# Patient Record
Sex: Female | Born: 1950 | Race: White | Hispanic: No | Marital: Married | Smoking: Former smoker
Health system: Southern US, Community
[De-identification: ages and names within clinical notes are randomized; demographics above are authoritative.]

## PROBLEM LIST (undated history)

## (undated) DIAGNOSIS — E039 Hypothyroidism, unspecified: Secondary | ICD-10-CM

## (undated) DIAGNOSIS — E785 Hyperlipidemia, unspecified: Secondary | ICD-10-CM

## (undated) DIAGNOSIS — I2510 Atherosclerotic heart disease of native coronary artery without angina pectoris: Secondary | ICD-10-CM

## (undated) DIAGNOSIS — M545 Low back pain, unspecified: Secondary | ICD-10-CM

## (undated) DIAGNOSIS — K635 Polyp of colon: Secondary | ICD-10-CM

## (undated) HISTORY — DX: Low back pain, unspecified: M54.50

## (undated) HISTORY — PX: THYROIDECTOMY: SHX17

## (undated) HISTORY — PX: TOTAL HIP ARTHROPLASTY: SHX124

## (undated) HISTORY — DX: Hypothyroidism, unspecified: E03.9

## (undated) HISTORY — DX: Low back pain: M54.5

## (undated) HISTORY — DX: Polyp of colon: K63.5

## (undated) HISTORY — PX: BREAST LUMPECTOMY: SHX2

## (undated) HISTORY — DX: Hyperlipidemia, unspecified: E78.5

## (undated) HISTORY — PX: BREAST EXCISIONAL BIOPSY: SUR124

---

## 1998-01-08 ENCOUNTER — Encounter: Payer: Self-pay | Admitting: Family Medicine

## 1998-01-08 ENCOUNTER — Ambulatory Visit (HOSPITAL_COMMUNITY): Admission: RE | Admit: 1998-01-08 | Discharge: 1998-01-08 | Payer: Self-pay | Admitting: Family Medicine

## 1998-02-28 ENCOUNTER — Other Ambulatory Visit: Admission: RE | Admit: 1998-02-28 | Discharge: 1998-02-28 | Payer: Self-pay | Admitting: Family Medicine

## 1999-02-12 ENCOUNTER — Encounter: Payer: Self-pay | Admitting: Family Medicine

## 1999-02-12 ENCOUNTER — Ambulatory Visit (HOSPITAL_COMMUNITY): Admission: RE | Admit: 1999-02-12 | Discharge: 1999-02-12 | Payer: Self-pay | Admitting: Family Medicine

## 1999-04-08 ENCOUNTER — Other Ambulatory Visit: Admission: RE | Admit: 1999-04-08 | Discharge: 1999-04-08 | Payer: Self-pay | Admitting: Family Medicine

## 2000-03-17 ENCOUNTER — Ambulatory Visit (HOSPITAL_COMMUNITY): Admission: RE | Admit: 2000-03-17 | Discharge: 2000-03-17 | Payer: Self-pay | Admitting: Family Medicine

## 2000-03-17 ENCOUNTER — Encounter: Payer: Self-pay | Admitting: Family Medicine

## 2000-04-09 ENCOUNTER — Other Ambulatory Visit: Admission: RE | Admit: 2000-04-09 | Discharge: 2000-04-09 | Payer: Self-pay | Admitting: Family Medicine

## 2001-04-08 ENCOUNTER — Ambulatory Visit (HOSPITAL_COMMUNITY): Admission: RE | Admit: 2001-04-08 | Discharge: 2001-04-08 | Payer: Self-pay | Admitting: Family Medicine

## 2001-04-08 ENCOUNTER — Encounter: Payer: Self-pay | Admitting: Family Medicine

## 2001-04-29 ENCOUNTER — Other Ambulatory Visit: Admission: RE | Admit: 2001-04-29 | Discharge: 2001-04-29 | Payer: Self-pay | Admitting: Family Medicine

## 2002-05-17 ENCOUNTER — Encounter: Payer: Self-pay | Admitting: Family Medicine

## 2002-05-17 ENCOUNTER — Ambulatory Visit (HOSPITAL_COMMUNITY): Admission: RE | Admit: 2002-05-17 | Discharge: 2002-05-17 | Payer: Self-pay | Admitting: Family Medicine

## 2002-07-21 ENCOUNTER — Other Ambulatory Visit: Admission: RE | Admit: 2002-07-21 | Discharge: 2002-07-21 | Payer: Self-pay | Admitting: Family Medicine

## 2003-05-19 ENCOUNTER — Ambulatory Visit (HOSPITAL_COMMUNITY): Admission: RE | Admit: 2003-05-19 | Discharge: 2003-05-19 | Payer: Self-pay | Admitting: Family Medicine

## 2004-01-18 ENCOUNTER — Other Ambulatory Visit: Admission: RE | Admit: 2004-01-18 | Discharge: 2004-01-18 | Payer: Self-pay | Admitting: Family Medicine

## 2004-05-20 ENCOUNTER — Ambulatory Visit (HOSPITAL_COMMUNITY): Admission: RE | Admit: 2004-05-20 | Discharge: 2004-05-20 | Payer: Self-pay | Admitting: Family Medicine

## 2005-05-22 ENCOUNTER — Ambulatory Visit (HOSPITAL_COMMUNITY): Admission: RE | Admit: 2005-05-22 | Discharge: 2005-05-22 | Payer: Self-pay | Admitting: Family Medicine

## 2005-05-30 ENCOUNTER — Other Ambulatory Visit: Admission: RE | Admit: 2005-05-30 | Discharge: 2005-05-30 | Payer: Self-pay | Admitting: Family Medicine

## 2006-06-01 ENCOUNTER — Ambulatory Visit (HOSPITAL_COMMUNITY): Admission: RE | Admit: 2006-06-01 | Discharge: 2006-06-01 | Payer: Self-pay | Admitting: Family Medicine

## 2006-07-22 ENCOUNTER — Other Ambulatory Visit: Admission: RE | Admit: 2006-07-22 | Discharge: 2006-07-22 | Payer: Self-pay | Admitting: Family Medicine

## 2007-06-07 ENCOUNTER — Ambulatory Visit (HOSPITAL_COMMUNITY): Admission: RE | Admit: 2007-06-07 | Discharge: 2007-06-07 | Payer: Self-pay | Admitting: Family Medicine

## 2007-07-28 ENCOUNTER — Ambulatory Visit (HOSPITAL_COMMUNITY): Admission: RE | Admit: 2007-07-28 | Discharge: 2007-07-28 | Payer: Self-pay | Admitting: Family Medicine

## 2007-12-12 ENCOUNTER — Observation Stay (HOSPITAL_COMMUNITY): Admission: EM | Admit: 2007-12-12 | Discharge: 2007-12-13 | Payer: Self-pay | Admitting: Emergency Medicine

## 2008-06-09 ENCOUNTER — Ambulatory Visit (HOSPITAL_COMMUNITY): Admission: RE | Admit: 2008-06-09 | Discharge: 2008-06-09 | Payer: Self-pay | Admitting: Family Medicine

## 2009-03-27 ENCOUNTER — Encounter: Admission: RE | Admit: 2009-03-27 | Discharge: 2009-03-27 | Payer: Self-pay | Admitting: Orthopedic Surgery

## 2009-06-14 ENCOUNTER — Ambulatory Visit (HOSPITAL_COMMUNITY): Admission: RE | Admit: 2009-06-14 | Discharge: 2009-06-14 | Payer: Self-pay | Admitting: Family Medicine

## 2009-10-22 ENCOUNTER — Other Ambulatory Visit: Admission: RE | Admit: 2009-10-22 | Discharge: 2009-10-22 | Payer: Self-pay | Admitting: Family Medicine

## 2009-10-31 ENCOUNTER — Ambulatory Visit (HOSPITAL_COMMUNITY): Admission: RE | Admit: 2009-10-31 | Discharge: 2009-10-31 | Payer: Self-pay | Admitting: Family Medicine

## 2010-06-04 NOTE — H&P (Signed)
NAMEALFREDO, COLLYMORE                ACCOUNT NO.:  [id number]   MEDICAL RECORD NO.:  [id number]          PATIENT TYPE:  INP   LOCATION:  1827                         FACILITY:  [facility]   PHYSICIAN:  Kela Millin, M.D.DATE OF BIRTH:  [birth year]   DATE OF ADMISSION:  12/12/2007  DATE OF DISCHARGE:                              HISTORY & PHYSICAL   PRIMARY CARE PHYSICIAN:  Pam Drown, M.D.   CHIEF COMPLAINT:  Chest pain.   HISTORY OF PRESENT ILLNESS:  The patient is a 60 year old white female  former smoker with past medical history significant for hyperlipidemia  being managed by low cholesterol and low fat diet at this time,  hypothyroidism who presents with the above complaints.  She states that  she woke up at 6:00 this morning with chest pain.  Described as left  precordial in location and radiating to her upper arm and back, 5/10 in  intensity at its worst and a pressure, constant, for about 3 hours until  she went to the [facility] walk-in clinic.  At the walk-in clinic she was  given sublingual nitroglycerin with some relief.  She was asked to come  to the [facility] ER.  She states by the time she got to the [facility] ER the chest  pain had resolved but while here in the ER she walked to the bathroom  and in the process she developed some chest pain again, just not as  severe as before and it resolved spontaneously.  She denies associated  shortness of breath, nausea, vomiting and no diaphoresis.  She admits to  paresthesias in her left hand.  She admits to a cough and postnasal drip  and states that she had bronchitis recently and the cough is improving.  She denies fevers, dysuria, abdominal pain, diarrhea, melena and  hematochezia.   She was seen in the ER and a chest x-ray was done which showed  cardiomegaly but no acute infiltrates.  Her D-dimer was within normal  limits at 0.26.  Point of care markers negative times one and her EKG no  ischemic changes, normal sinus rhythm at  66.  She is admitted for  further evaluation and management.   PAST MEDICAL HISTORY:  As above.  Status post thyroidectomy for question  goiter.   MEDICATIONS:  1. Synthroid 200 mcg on Mondays, Wednesdays and Fridays and 175 mcg on      Tuesdays, Thursdays, Saturdays and Sundays.  2. Calcium.   ALLERGIES:  AMOXICILLIN.   SOCIAL HISTORY:  She states that she quit tobacco 5-6 years ago after  smoking two packs per day for 15 years.  Occasional glass of wine.   FAMILY HISTORY:  Her mother had lung cancer and her father had atrial  fibrillation and valvular heart disease - he died while in surgery for  his valves.   REVIEW OF SYSTEMS:  As per HPI, other review of systems negative.   PHYSICAL EXAM:  GENERAL:  In general, the patient is a pleasant middle-  aged white female.  She is alert and appropriate and in no apparent  distress.  VITAL SIGNS:  Temperature is 98.3 with a blood pressure of 130/74, pulse  66, respiratory rate of 18, O2 sat of 99%.  HEENT:  PERRL, EOMI, sclerae anicteric.  Moist mucous membranes and no  oral exudates.  NECK:  Supple, no adenopathy, no thyromegaly and no JVD.  LUNGS:  Clear to auscultation bilaterally.  No crackles or wheezes.  CARDIOVASCULAR:  Regular rate and rhythm.  Normal S1-S2.  No chest wall  tenderness.  No S3 appreciated.  ABDOMEN:  Soft, bowel sounds present, nontender, nondistended.  No  organomegaly and no masses palpable.  EXTREMITIES:  No cyanosis and no edema.   LABORATORY DATA:  As per HPI, also white cell count is 5.9, hemoglobin  13.5, hematocrit of 40, platelet count of 258.  Sodium is 140 with a  potassium of 4.5, chloride 105, CO2 of 29, glucose 103, BUN of 10,  creatinine 0.53, calcium 9.4, AST is 20, ALT is 19, albumin is 3.9.   ASSESSMENT AND PLAN:  1. Chest pain - as discussed above, will obtain serial cardiac enzymes      and recheck a fasting lipid profile.  Follow and consult cardiology      for possible stress  Cardiolite if enzymes negative.  Will place on      nitroglycerin and aspirin.  D-dimer negative as above.  2. Hypercholesterolemia - as above.  Will recheck, currently on low-      fat, low-cholesterol diet.  3. Hypothyroidism - continue on outpatient medications.      Kela Millin, M.D.  Electronically Signed     ACV/MEDQ  D:  12/12/2007  T:  12/12/2007  Job:  [id number]   cc:   Pam Drown, M.D.

## 2010-07-30 ENCOUNTER — Other Ambulatory Visit (HOSPITAL_COMMUNITY): Payer: Self-pay | Admitting: Family Medicine

## 2010-07-30 DIAGNOSIS — Z1231 Encounter for screening mammogram for malignant neoplasm of breast: Secondary | ICD-10-CM

## 2010-08-09 ENCOUNTER — Ambulatory Visit (HOSPITAL_COMMUNITY)
Admission: RE | Admit: 2010-08-09 | Discharge: 2010-08-09 | Disposition: A | Discharge status: Home / Self Care | Payer: BC Managed Care – PPO | Source: Ambulatory Visit | Attending: Family Medicine | Admitting: Family Medicine

## 2010-08-09 DIAGNOSIS — Z1231 Encounter for screening mammogram for malignant neoplasm of breast: Secondary | ICD-10-CM | POA: Insufficient documentation

## 2010-10-23 LAB — CBC
HCT: 40
Hemoglobin: 13.5
MCV: 95.4
Platelets: 258

## 2010-10-23 LAB — DIFFERENTIAL
Basophils Absolute: 0.1
Eosinophils Relative: 2
Monocytes Absolute: 0.5
Neutrophils Relative %: 51

## 2010-10-23 LAB — LIPID PANEL
LDL Cholesterol: 171 — ABNORMAL HIGH
Total CHOL/HDL Ratio: 6.3
Triglycerides: 125
VLDL: 25

## 2010-10-23 LAB — COMPREHENSIVE METABOLIC PANEL
ALT: 19
AST: 20
Alkaline Phosphatase: 55
BUN: 10
CO2: 29
Calcium: 9.4
GFR calc non Af Amer: >60
Sodium: 140

## 2010-10-23 LAB — TROPONIN I: Troponin I: <0.01

## 2010-10-23 LAB — D-DIMER, QUANTITATIVE: D-Dimer, Quant: 0.26

## 2010-10-23 LAB — CARDIAC PANEL(CRET KIN+CKTOT+MB+TROPI)
CK, MB: 1
Total CK: 47
Troponin I: <0.01

## 2010-10-23 LAB — POCT CARDIAC MARKERS

## 2010-10-23 LAB — PROTIME-INR: INR: 0.9

## 2011-02-14 ENCOUNTER — Other Ambulatory Visit: Payer: Self-pay | Admitting: Gastroenterology

## 2011-09-01 ENCOUNTER — Other Ambulatory Visit (HOSPITAL_COMMUNITY): Payer: Self-pay | Admitting: Family Medicine

## 2011-09-01 DIAGNOSIS — Z1231 Encounter for screening mammogram for malignant neoplasm of breast: Secondary | ICD-10-CM

## 2011-09-18 ENCOUNTER — Ambulatory Visit (HOSPITAL_COMMUNITY)
Admission: RE | Admit: 2011-09-18 | Discharge: 2011-09-18 | Disposition: A | Discharge status: Home / Self Care | Payer: BC Managed Care – PPO | Source: Ambulatory Visit | Attending: Family Medicine | Admitting: Family Medicine

## 2011-09-18 DIAGNOSIS — Z1231 Encounter for screening mammogram for malignant neoplasm of breast: Secondary | ICD-10-CM | POA: Insufficient documentation

## 2012-09-23 ENCOUNTER — Other Ambulatory Visit (HOSPITAL_COMMUNITY): Payer: Self-pay | Admitting: Family Medicine

## 2012-09-23 DIAGNOSIS — Z1231 Encounter for screening mammogram for malignant neoplasm of breast: Secondary | ICD-10-CM

## 2012-10-01 ENCOUNTER — Ambulatory Visit (HOSPITAL_COMMUNITY)
Admission: RE | Admit: 2012-10-01 | Discharge: 2012-10-01 | Disposition: A | Discharge status: Home / Self Care | Payer: BC Managed Care – PPO | Source: Ambulatory Visit | Attending: Family Medicine | Admitting: Family Medicine

## 2012-10-01 DIAGNOSIS — Z1231 Encounter for screening mammogram for malignant neoplasm of breast: Secondary | ICD-10-CM

## 2012-10-27 ENCOUNTER — Other Ambulatory Visit (HOSPITAL_COMMUNITY)
Admission: RE | Admit: 2012-10-27 | Discharge: 2012-10-27 | Disposition: A | Discharge status: Home / Self Care | Payer: BC Managed Care – PPO | Source: Ambulatory Visit | Attending: Family Medicine | Admitting: Family Medicine

## 2012-10-27 ENCOUNTER — Other Ambulatory Visit: Payer: Self-pay | Admitting: Family Medicine

## 2012-10-27 DIAGNOSIS — Z1151 Encounter for screening for human papillomavirus (HPV): Secondary | ICD-10-CM | POA: Insufficient documentation

## 2012-10-27 DIAGNOSIS — Z01419 Encounter for gynecological examination (general) (routine) without abnormal findings: Secondary | ICD-10-CM | POA: Insufficient documentation

## 2012-10-28 ENCOUNTER — Other Ambulatory Visit: Payer: Self-pay | Admitting: Family Medicine

## 2012-10-28 DIAGNOSIS — R519 Headache, unspecified: Secondary | ICD-10-CM

## 2012-11-01 ENCOUNTER — Encounter: Payer: Self-pay | Admitting: *Deleted

## 2012-11-01 ENCOUNTER — Encounter: Payer: Self-pay | Admitting: Cardiology

## 2012-11-01 DIAGNOSIS — K635 Polyp of colon: Secondary | ICD-10-CM | POA: Insufficient documentation

## 2012-11-01 DIAGNOSIS — E782 Mixed hyperlipidemia: Secondary | ICD-10-CM | POA: Insufficient documentation

## 2012-11-01 DIAGNOSIS — M545 Low back pain, unspecified: Secondary | ICD-10-CM | POA: Insufficient documentation

## 2012-11-01 DIAGNOSIS — E785 Hyperlipidemia, unspecified: Secondary | ICD-10-CM | POA: Insufficient documentation

## 2012-11-01 DIAGNOSIS — E039 Hypothyroidism, unspecified: Secondary | ICD-10-CM | POA: Insufficient documentation

## 2012-11-04 ENCOUNTER — Ambulatory Visit (INDEPENDENT_AMBULATORY_CARE_PROVIDER_SITE_OTHER): Payer: BC Managed Care – PPO | Admitting: Cardiology

## 2012-11-04 ENCOUNTER — Encounter: Payer: Self-pay | Admitting: Cardiology

## 2012-11-04 VITALS — BP 141/65 | HR 87 | Ht 67.0 in | Wt 194.0 lb

## 2012-11-04 DIAGNOSIS — Z79899 Other long term (current) drug therapy: Secondary | ICD-10-CM

## 2012-11-04 DIAGNOSIS — E7800 Pure hypercholesterolemia, unspecified: Secondary | ICD-10-CM

## 2012-11-04 MED ORDER — EZETIMIBE 10 MG PO TABS: 10.0000 mg | ORAL_TABLET | Freq: Every day | ORAL | Status: DC

## 2012-11-04 NOTE — Progress Notes (Signed)
  [address] [place], [place]  [address] Phone: [phone] Fax:  [phone]  Date:  11/04/2012   ID:  Rebekah Curry, DOB 1950/04/13, MRN [id number]  PCP:  Selena Batten, MD  Cardiologist:  None    History of Present Illness: Rebekah Curry is a 62 y.o. female  With a history of dyslipidemia, hypothyroidism and chronic back pain who presents for evaluation of dyslipidemia.  She has tried Crestor, zocor and liptor and had severe muscle aches which exacerbated her underlying back problems.  She is currently on no lipid lowering drugs.  She denies any chest pain, SOB, DOE, palpitations, LE edema or dizziness.   Wt Readings from Last 3 Encounters:  11/04/12 194 lb (87.998 kg)     Past Medical History  Diagnosis Date  . Hyperlipidemia   . Hypothyroidism   . Low back pain   . Colon polyp     Current Outpatient Prescriptions  Medication Sig Dispense Refill  . Cholecalciferol (VITAMIN D) 2000 UNITS CAPS Take 1 capsule by mouth daily.      Marland Kitchen levothyroxine (SYNTHROID, LEVOTHROID) 137 MCG tablet TAKE 2 TABLETS DAILY      . Multiple Vitamin (MULTIVITAMIN) capsule Take 1 capsule by mouth daily.       No current facility-administered medications for this visit.    Allergies:    Allergies  Allergen Reactions  . Amoxil [Amoxicillin]     RASH    Social History:  The patient  reports that she has quit smoking. She does not have any smokeless tobacco history on file. She reports that she does not drink alcohol or use illicit drugs.   Family History:  The patient's family history is not on file.   ROS:  Please see the history of present illness.      All other systems reviewed and negative.   PHYSICAL EXAM: VS:  BP 141/65  Pulse 87  Ht 5\' 7"  (1.702 m)  Wt 194 lb (87.998 kg)  BMI 30.38 kg/m2 Well nourished, well developed, in no acute distress HEENT: normal Neck: no JVD Cardiac:  normal S1, S2; RRR; no murmur Lungs:  clear to auscultation bilaterally, no  wheezing, rhonchi or rales Abd: soft, nontender, no hepatomegaly Ext: no edema Skin: warm and dry Neuro:  CNs 2-12 intact, no focal abnormalities noted   EKG:  NSR  ASSESSMENT AND PLAN:  1. Dyslipidemia - statin intolerant.  Her LDL is 199, HDL 50  - I have recommended that we start her on Zetia 10mg  daily  - recheck Fasting lipid panel/ALT in 8 weeks  - I have asked her to call if she has any symptoms on the zetia  Followup with me in 6 months   Signed, Armanda Magic, MD 11/04/2012 5:30 PM

## 2012-11-04 NOTE — Patient Instructions (Signed)
Your physician recommends that you return for lab work in: 8 weeks for you Lipid and hepatic panel (12/30/12)  Your physician recommends that you continue on your current medications as directed. Please refer to the Current Medication list given to you today.  I called in a rx for Zetia to your Pharmacy  Your physician recommends that you schedule a follow-up appointment in: 6 months with Dr. Mayford Knife.

## 2012-11-06 ENCOUNTER — Ambulatory Visit
Admission: RE | Admit: 2012-11-06 | Discharge: 2012-11-06 | Disposition: A | Discharge status: Home / Self Care | Payer: BC Managed Care – PPO | Source: Ambulatory Visit | Attending: Family Medicine | Admitting: Family Medicine

## 2012-11-06 DIAGNOSIS — R519 Headache, unspecified: Secondary | ICD-10-CM

## 2012-11-06 MED ORDER — GADOBENATE DIMEGLUMINE 529 MG/ML IV SOLN
19.0000 mL | Freq: Once | INTRAVENOUS | Status: AC | PRN
  Administered 2012-11-06: 19 mL via INTRAVENOUS

## 2012-12-30 ENCOUNTER — Other Ambulatory Visit (INDEPENDENT_AMBULATORY_CARE_PROVIDER_SITE_OTHER): Payer: BC Managed Care – PPO

## 2012-12-30 DIAGNOSIS — E7800 Pure hypercholesterolemia, unspecified: Secondary | ICD-10-CM

## 2012-12-30 DIAGNOSIS — Z79899 Other long term (current) drug therapy: Secondary | ICD-10-CM

## 2012-12-30 LAB — HEPATIC FUNCTION PANEL
ALT: 24 U/L (ref 0–35)
Alkaline Phosphatase: 62 U/L (ref 39–117)
Bilirubin, Direct: 0 mg/dL (ref 0.0–0.3)
Total Bilirubin: 0.6 mg/dL (ref 0.3–1.2)
Total Protein: 7.7 g/dL (ref 6.0–8.3)

## 2013-01-19 ENCOUNTER — Telehealth: Payer: Self-pay | Admitting: General Surgery

## 2013-01-19 ENCOUNTER — Encounter: Payer: Self-pay | Admitting: General Surgery

## 2013-01-19 DIAGNOSIS — E785 Hyperlipidemia, unspecified: Secondary | ICD-10-CM

## 2013-01-19 MED ORDER — COLESEVELAM HCL 3.75 G PO PACK: 1.0000 | PACK | Freq: Every day | ORAL | Status: DC

## 2013-01-19 NOTE — Telephone Encounter (Signed)
Message copied by Nita Sells on Wed Jan 19, 2013  9:12 AM ------      Message from: SMART, Gaspar Skeeters      Created: Thu Jan 06, 2013  3:46 PM       Only other option other than a statin would be to add Welchol to her Zetia.  Welchol can drop LDL another 20% reduction, and the combination of Zetia + Welchol can give a similar LDL reduction to a statin, but without the increase risk of muscle aches.  Welchol is very well tolerated.      Plan:      1.  Continue Zetia 10 mg qd.      2.  Add Welchol 3.75 g powder for suspension.  Mix 1 packet with 4-8 ounces of water or juice, and drink this once daily.  This should be taken at a different time of day as her thyroid medication.      3.  Recheck lipid panel and hepatic panel in 3 months, and we can call her with results.        Please notify patient, and set up labs. Thanks. ------

## 2013-04-20 ENCOUNTER — Other Ambulatory Visit: Payer: BC Managed Care – PPO

## 2013-10-06 ENCOUNTER — Other Ambulatory Visit (HOSPITAL_COMMUNITY): Payer: Self-pay | Admitting: Family Medicine

## 2013-10-06 DIAGNOSIS — Z1231 Encounter for screening mammogram for malignant neoplasm of breast: Secondary | ICD-10-CM

## 2013-10-13 ENCOUNTER — Ambulatory Visit (HOSPITAL_COMMUNITY)
Admission: RE | Admit: 2013-10-13 | Discharge: 2013-10-13 | Disposition: A | Discharge status: Home / Self Care | Payer: BC Managed Care – PPO | Source: Ambulatory Visit | Attending: Family Medicine | Admitting: Family Medicine

## 2013-10-13 DIAGNOSIS — Z1231 Encounter for screening mammogram for malignant neoplasm of breast: Secondary | ICD-10-CM | POA: Diagnosis present

## 2014-08-16 ENCOUNTER — Other Ambulatory Visit: Payer: Self-pay | Admitting: Family Medicine

## 2014-08-16 ENCOUNTER — Ambulatory Visit
Admission: RE | Admit: 2014-08-16 | Discharge: 2014-08-16 | Disposition: A | Discharge status: Home / Self Care | Payer: BLUE CROSS/BLUE SHIELD | Source: Ambulatory Visit | Attending: Family Medicine | Admitting: Family Medicine

## 2014-08-16 DIAGNOSIS — J189 Pneumonia, unspecified organism: Secondary | ICD-10-CM

## 2014-11-14 ENCOUNTER — Other Ambulatory Visit: Payer: Self-pay

## 2014-11-14 DIAGNOSIS — Z1231 Encounter for screening mammogram for malignant neoplasm of breast: Secondary | ICD-10-CM

## 2014-11-28 ENCOUNTER — Ambulatory Visit
Admission: RE | Admit: 2014-11-28 | Discharge: 2014-11-28 | Disposition: A | Discharge status: Home / Self Care | Payer: BLUE CROSS/BLUE SHIELD | Source: Ambulatory Visit

## 2014-11-28 DIAGNOSIS — Z1231 Encounter for screening mammogram for malignant neoplasm of breast: Secondary | ICD-10-CM

## 2015-01-21 HISTORY — PX: BREAST BIOPSY: SHX20

## 2015-06-06 DIAGNOSIS — E039 Hypothyroidism, unspecified: Secondary | ICD-10-CM | POA: Diagnosis not present

## 2015-06-06 DIAGNOSIS — E782 Mixed hyperlipidemia: Secondary | ICD-10-CM | POA: Diagnosis not present

## 2015-06-06 DIAGNOSIS — M8580 Other specified disorders of bone density and structure, unspecified site: Secondary | ICD-10-CM | POA: Diagnosis not present

## 2015-07-13 DIAGNOSIS — R319 Hematuria, unspecified: Secondary | ICD-10-CM | POA: Diagnosis not present

## 2015-07-13 DIAGNOSIS — R300 Dysuria: Secondary | ICD-10-CM | POA: Diagnosis not present

## 2015-07-16 DIAGNOSIS — E039 Hypothyroidism, unspecified: Secondary | ICD-10-CM | POA: Diagnosis not present

## 2015-07-16 DIAGNOSIS — J45909 Unspecified asthma, uncomplicated: Secondary | ICD-10-CM | POA: Diagnosis not present

## 2015-07-16 DIAGNOSIS — R319 Hematuria, unspecified: Secondary | ICD-10-CM | POA: Diagnosis not present

## 2015-07-18 DIAGNOSIS — R310 Gross hematuria: Secondary | ICD-10-CM | POA: Diagnosis not present

## 2015-07-20 DIAGNOSIS — R319 Hematuria, unspecified: Secondary | ICD-10-CM | POA: Diagnosis not present

## 2015-07-20 DIAGNOSIS — E039 Hypothyroidism, unspecified: Secondary | ICD-10-CM | POA: Diagnosis not present

## 2015-07-20 DIAGNOSIS — R300 Dysuria: Secondary | ICD-10-CM | POA: Diagnosis not present

## 2015-07-26 DIAGNOSIS — R310 Gross hematuria: Secondary | ICD-10-CM | POA: Diagnosis not present

## 2015-08-01 DIAGNOSIS — N8111 Cystocele, midline: Secondary | ICD-10-CM | POA: Diagnosis not present

## 2015-08-01 DIAGNOSIS — R310 Gross hematuria: Secondary | ICD-10-CM | POA: Diagnosis not present

## 2015-08-01 DIAGNOSIS — B373 Candidiasis of vulva and vagina: Secondary | ICD-10-CM | POA: Diagnosis not present

## 2015-08-01 DIAGNOSIS — N393 Stress incontinence (female) (male): Secondary | ICD-10-CM | POA: Diagnosis not present

## 2015-08-07 DIAGNOSIS — M4726 Other spondylosis with radiculopathy, lumbar region: Secondary | ICD-10-CM | POA: Diagnosis not present

## 2015-08-07 DIAGNOSIS — M47816 Spondylosis without myelopathy or radiculopathy, lumbar region: Secondary | ICD-10-CM | POA: Diagnosis not present

## 2015-08-09 DIAGNOSIS — M47816 Spondylosis without myelopathy or radiculopathy, lumbar region: Secondary | ICD-10-CM | POA: Diagnosis not present

## 2015-08-09 DIAGNOSIS — M4726 Other spondylosis with radiculopathy, lumbar region: Secondary | ICD-10-CM | POA: Diagnosis not present

## 2015-08-10 DIAGNOSIS — M4726 Other spondylosis with radiculopathy, lumbar region: Secondary | ICD-10-CM | POA: Diagnosis not present

## 2015-08-10 DIAGNOSIS — M47816 Spondylosis without myelopathy or radiculopathy, lumbar region: Secondary | ICD-10-CM | POA: Diagnosis not present

## 2015-08-13 DIAGNOSIS — M47816 Spondylosis without myelopathy or radiculopathy, lumbar region: Secondary | ICD-10-CM | POA: Diagnosis not present

## 2015-08-13 DIAGNOSIS — M4726 Other spondylosis with radiculopathy, lumbar region: Secondary | ICD-10-CM | POA: Diagnosis not present

## 2015-08-15 DIAGNOSIS — M47816 Spondylosis without myelopathy or radiculopathy, lumbar region: Secondary | ICD-10-CM | POA: Diagnosis not present

## 2015-08-15 DIAGNOSIS — M4726 Other spondylosis with radiculopathy, lumbar region: Secondary | ICD-10-CM | POA: Diagnosis not present

## 2015-08-16 DIAGNOSIS — M4726 Other spondylosis with radiculopathy, lumbar region: Secondary | ICD-10-CM | POA: Diagnosis not present

## 2015-08-16 DIAGNOSIS — M47816 Spondylosis without myelopathy or radiculopathy, lumbar region: Secondary | ICD-10-CM | POA: Diagnosis not present

## 2015-08-20 DIAGNOSIS — M47816 Spondylosis without myelopathy or radiculopathy, lumbar region: Secondary | ICD-10-CM | POA: Diagnosis not present

## 2015-08-20 DIAGNOSIS — M4726 Other spondylosis with radiculopathy, lumbar region: Secondary | ICD-10-CM | POA: Diagnosis not present

## 2015-08-23 DIAGNOSIS — M47816 Spondylosis without myelopathy or radiculopathy, lumbar region: Secondary | ICD-10-CM | POA: Diagnosis not present

## 2015-08-23 DIAGNOSIS — M4726 Other spondylosis with radiculopathy, lumbar region: Secondary | ICD-10-CM | POA: Diagnosis not present

## 2015-08-28 DIAGNOSIS — M4726 Other spondylosis with radiculopathy, lumbar region: Secondary | ICD-10-CM | POA: Diagnosis not present

## 2015-08-28 DIAGNOSIS — M47816 Spondylosis without myelopathy or radiculopathy, lumbar region: Secondary | ICD-10-CM | POA: Diagnosis not present

## 2015-09-03 DIAGNOSIS — M47816 Spondylosis without myelopathy or radiculopathy, lumbar region: Secondary | ICD-10-CM | POA: Diagnosis not present

## 2015-09-03 DIAGNOSIS — M4726 Other spondylosis with radiculopathy, lumbar region: Secondary | ICD-10-CM | POA: Diagnosis not present

## 2015-09-17 DIAGNOSIS — M47816 Spondylosis without myelopathy or radiculopathy, lumbar region: Secondary | ICD-10-CM | POA: Diagnosis not present

## 2015-09-17 DIAGNOSIS — M4726 Other spondylosis with radiculopathy, lumbar region: Secondary | ICD-10-CM | POA: Diagnosis not present

## 2015-09-21 DIAGNOSIS — E039 Hypothyroidism, unspecified: Secondary | ICD-10-CM | POA: Diagnosis not present

## 2015-09-25 DIAGNOSIS — M4726 Other spondylosis with radiculopathy, lumbar region: Secondary | ICD-10-CM | POA: Diagnosis not present

## 2015-09-25 DIAGNOSIS — M47816 Spondylosis without myelopathy or radiculopathy, lumbar region: Secondary | ICD-10-CM | POA: Diagnosis not present

## 2015-10-02 DIAGNOSIS — M47816 Spondylosis without myelopathy or radiculopathy, lumbar region: Secondary | ICD-10-CM | POA: Diagnosis not present

## 2015-10-02 DIAGNOSIS — M4726 Other spondylosis with radiculopathy, lumbar region: Secondary | ICD-10-CM | POA: Diagnosis not present

## 2015-10-04 DIAGNOSIS — C44622 Squamous cell carcinoma of skin of right upper limb, including shoulder: Secondary | ICD-10-CM | POA: Diagnosis not present

## 2015-10-04 DIAGNOSIS — Z08 Encounter for follow-up examination after completed treatment for malignant neoplasm: Secondary | ICD-10-CM | POA: Diagnosis not present

## 2015-10-04 DIAGNOSIS — Z85828 Personal history of other malignant neoplasm of skin: Secondary | ICD-10-CM | POA: Diagnosis not present

## 2015-10-04 DIAGNOSIS — L820 Inflamed seborrheic keratosis: Secondary | ICD-10-CM | POA: Diagnosis not present

## 2015-10-08 DIAGNOSIS — M47816 Spondylosis without myelopathy or radiculopathy, lumbar region: Secondary | ICD-10-CM | POA: Diagnosis not present

## 2015-10-08 DIAGNOSIS — M4726 Other spondylosis with radiculopathy, lumbar region: Secondary | ICD-10-CM | POA: Diagnosis not present

## 2015-10-21 DIAGNOSIS — J209 Acute bronchitis, unspecified: Secondary | ICD-10-CM | POA: Diagnosis not present

## 2015-10-21 DIAGNOSIS — J069 Acute upper respiratory infection, unspecified: Secondary | ICD-10-CM | POA: Diagnosis not present

## 2015-10-26 DIAGNOSIS — J189 Pneumonia, unspecified organism: Secondary | ICD-10-CM | POA: Diagnosis not present

## 2015-10-26 DIAGNOSIS — J9801 Acute bronchospasm: Secondary | ICD-10-CM | POA: Diagnosis not present

## 2015-11-05 DIAGNOSIS — M4726 Other spondylosis with radiculopathy, lumbar region: Secondary | ICD-10-CM | POA: Diagnosis not present

## 2015-11-05 DIAGNOSIS — M47816 Spondylosis without myelopathy or radiculopathy, lumbar region: Secondary | ICD-10-CM | POA: Diagnosis not present

## 2015-11-06 ENCOUNTER — Other Ambulatory Visit: Payer: Self-pay | Admitting: Family Medicine

## 2015-11-06 DIAGNOSIS — Z1231 Encounter for screening mammogram for malignant neoplasm of breast: Secondary | ICD-10-CM

## 2015-11-09 DIAGNOSIS — J9801 Acute bronchospasm: Secondary | ICD-10-CM | POA: Diagnosis not present

## 2015-11-09 DIAGNOSIS — J189 Pneumonia, unspecified organism: Secondary | ICD-10-CM | POA: Diagnosis not present

## 2015-11-15 DIAGNOSIS — Z85828 Personal history of other malignant neoplasm of skin: Secondary | ICD-10-CM | POA: Diagnosis not present

## 2015-11-15 DIAGNOSIS — E039 Hypothyroidism, unspecified: Secondary | ICD-10-CM | POA: Diagnosis not present

## 2015-11-15 DIAGNOSIS — Z08 Encounter for follow-up examination after completed treatment for malignant neoplasm: Secondary | ICD-10-CM | POA: Diagnosis not present

## 2015-11-15 DIAGNOSIS — E782 Mixed hyperlipidemia: Secondary | ICD-10-CM | POA: Diagnosis not present

## 2015-11-15 DIAGNOSIS — L820 Inflamed seborrheic keratosis: Secondary | ICD-10-CM | POA: Diagnosis not present

## 2015-11-19 DIAGNOSIS — M4726 Other spondylosis with radiculopathy, lumbar region: Secondary | ICD-10-CM | POA: Diagnosis not present

## 2015-11-19 DIAGNOSIS — M47816 Spondylosis without myelopathy or radiculopathy, lumbar region: Secondary | ICD-10-CM | POA: Diagnosis not present

## 2015-11-20 DIAGNOSIS — E039 Hypothyroidism, unspecified: Secondary | ICD-10-CM | POA: Diagnosis not present

## 2015-11-20 DIAGNOSIS — J189 Pneumonia, unspecified organism: Secondary | ICD-10-CM | POA: Diagnosis not present

## 2015-11-20 DIAGNOSIS — Z23 Encounter for immunization: Secondary | ICD-10-CM | POA: Diagnosis not present

## 2015-11-20 DIAGNOSIS — Z0000 Encounter for general adult medical examination without abnormal findings: Secondary | ICD-10-CM | POA: Diagnosis not present

## 2015-11-20 DIAGNOSIS — E782 Mixed hyperlipidemia: Secondary | ICD-10-CM | POA: Diagnosis not present

## 2015-11-27 DIAGNOSIS — M47816 Spondylosis without myelopathy or radiculopathy, lumbar region: Secondary | ICD-10-CM | POA: Diagnosis not present

## 2015-11-27 DIAGNOSIS — M4726 Other spondylosis with radiculopathy, lumbar region: Secondary | ICD-10-CM | POA: Diagnosis not present

## 2015-11-29 ENCOUNTER — Ambulatory Visit
Admission: RE | Admit: 2015-11-29 | Discharge: 2015-11-29 | Disposition: A | Discharge status: Home / Self Care | Payer: BLUE CROSS/BLUE SHIELD | Source: Ambulatory Visit | Attending: Family Medicine | Admitting: Family Medicine

## 2015-11-29 DIAGNOSIS — Z1231 Encounter for screening mammogram for malignant neoplasm of breast: Secondary | ICD-10-CM

## 2015-12-03 DIAGNOSIS — M4726 Other spondylosis with radiculopathy, lumbar region: Secondary | ICD-10-CM | POA: Diagnosis not present

## 2015-12-03 DIAGNOSIS — M47816 Spondylosis without myelopathy or radiculopathy, lumbar region: Secondary | ICD-10-CM | POA: Diagnosis not present

## 2015-12-04 ENCOUNTER — Other Ambulatory Visit: Payer: Self-pay | Admitting: Family Medicine

## 2015-12-04 DIAGNOSIS — R921 Mammographic calcification found on diagnostic imaging of breast: Secondary | ICD-10-CM

## 2015-12-05 DIAGNOSIS — M4726 Other spondylosis with radiculopathy, lumbar region: Secondary | ICD-10-CM | POA: Diagnosis not present

## 2015-12-05 DIAGNOSIS — M47816 Spondylosis without myelopathy or radiculopathy, lumbar region: Secondary | ICD-10-CM | POA: Diagnosis not present

## 2015-12-07 ENCOUNTER — Ambulatory Visit
Admission: RE | Admit: 2015-12-07 | Discharge: 2015-12-07 | Disposition: A | Discharge status: Home / Self Care | Payer: BLUE CROSS/BLUE SHIELD | Source: Ambulatory Visit | Attending: Family Medicine | Admitting: Family Medicine

## 2015-12-07 ENCOUNTER — Other Ambulatory Visit: Payer: Self-pay | Admitting: Family Medicine

## 2015-12-07 DIAGNOSIS — M47816 Spondylosis without myelopathy or radiculopathy, lumbar region: Secondary | ICD-10-CM | POA: Diagnosis not present

## 2015-12-07 DIAGNOSIS — M4726 Other spondylosis with radiculopathy, lumbar region: Secondary | ICD-10-CM | POA: Diagnosis not present

## 2015-12-07 DIAGNOSIS — R921 Mammographic calcification found on diagnostic imaging of breast: Secondary | ICD-10-CM

## 2015-12-20 ENCOUNTER — Ambulatory Visit
Admission: RE | Admit: 2015-12-20 | Discharge: 2015-12-20 | Disposition: A | Discharge status: Home / Self Care | Payer: BLUE CROSS/BLUE SHIELD | Source: Ambulatory Visit | Attending: Family Medicine | Admitting: Family Medicine

## 2015-12-20 ENCOUNTER — Other Ambulatory Visit: Payer: Self-pay | Admitting: Family Medicine

## 2015-12-20 DIAGNOSIS — R921 Mammographic calcification found on diagnostic imaging of breast: Secondary | ICD-10-CM

## 2015-12-20 DIAGNOSIS — D241 Benign neoplasm of right breast: Secondary | ICD-10-CM | POA: Diagnosis not present

## 2015-12-20 DIAGNOSIS — N6489 Other specified disorders of breast: Secondary | ICD-10-CM | POA: Diagnosis not present

## 2016-01-19 DIAGNOSIS — H578 Other specified disorders of eye and adnexa: Secondary | ICD-10-CM | POA: Diagnosis not present

## 2016-02-15 DIAGNOSIS — Z87891 Personal history of nicotine dependence: Secondary | ICD-10-CM | POA: Diagnosis not present

## 2016-02-15 DIAGNOSIS — N393 Stress incontinence (female) (male): Secondary | ICD-10-CM | POA: Diagnosis not present

## 2016-02-15 DIAGNOSIS — N8111 Cystocele, midline: Secondary | ICD-10-CM | POA: Diagnosis not present

## 2016-02-15 DIAGNOSIS — R310 Gross hematuria: Secondary | ICD-10-CM | POA: Diagnosis not present

## 2016-03-03 DIAGNOSIS — M4726 Other spondylosis with radiculopathy, lumbar region: Secondary | ICD-10-CM | POA: Diagnosis not present

## 2016-03-03 DIAGNOSIS — M47816 Spondylosis without myelopathy or radiculopathy, lumbar region: Secondary | ICD-10-CM | POA: Diagnosis not present

## 2016-03-04 DIAGNOSIS — M47816 Spondylosis without myelopathy or radiculopathy, lumbar region: Secondary | ICD-10-CM | POA: Diagnosis not present

## 2016-03-04 DIAGNOSIS — M4726 Other spondylosis with radiculopathy, lumbar region: Secondary | ICD-10-CM | POA: Diagnosis not present

## 2016-03-06 DIAGNOSIS — M4726 Other spondylosis with radiculopathy, lumbar region: Secondary | ICD-10-CM | POA: Diagnosis not present

## 2016-03-06 DIAGNOSIS — M47816 Spondylosis without myelopathy or radiculopathy, lumbar region: Secondary | ICD-10-CM | POA: Diagnosis not present

## 2016-03-10 DIAGNOSIS — M4726 Other spondylosis with radiculopathy, lumbar region: Secondary | ICD-10-CM | POA: Diagnosis not present

## 2016-03-10 DIAGNOSIS — M47816 Spondylosis without myelopathy or radiculopathy, lumbar region: Secondary | ICD-10-CM | POA: Diagnosis not present

## 2016-03-12 DIAGNOSIS — M79672 Pain in left foot: Secondary | ICD-10-CM | POA: Diagnosis not present

## 2016-03-12 DIAGNOSIS — S93332D Other subluxation of left foot, subsequent encounter: Secondary | ICD-10-CM | POA: Diagnosis not present

## 2016-03-13 DIAGNOSIS — M47816 Spondylosis without myelopathy or radiculopathy, lumbar region: Secondary | ICD-10-CM | POA: Diagnosis not present

## 2016-03-13 DIAGNOSIS — M4726 Other spondylosis with radiculopathy, lumbar region: Secondary | ICD-10-CM | POA: Diagnosis not present

## 2016-03-17 DIAGNOSIS — M47816 Spondylosis without myelopathy or radiculopathy, lumbar region: Secondary | ICD-10-CM | POA: Diagnosis not present

## 2016-03-17 DIAGNOSIS — M4726 Other spondylosis with radiculopathy, lumbar region: Secondary | ICD-10-CM | POA: Diagnosis not present

## 2016-03-19 DIAGNOSIS — M47816 Spondylosis without myelopathy or radiculopathy, lumbar region: Secondary | ICD-10-CM | POA: Diagnosis not present

## 2016-03-19 DIAGNOSIS — M4726 Other spondylosis with radiculopathy, lumbar region: Secondary | ICD-10-CM | POA: Diagnosis not present

## 2016-05-08 DIAGNOSIS — L923 Foreign body granuloma of the skin and subcutaneous tissue: Secondary | ICD-10-CM | POA: Diagnosis not present

## 2016-05-08 DIAGNOSIS — X32XXXD Exposure to sunlight, subsequent encounter: Secondary | ICD-10-CM | POA: Diagnosis not present

## 2016-05-08 DIAGNOSIS — L570 Actinic keratosis: Secondary | ICD-10-CM | POA: Diagnosis not present

## 2016-06-12 DIAGNOSIS — X32XXXD Exposure to sunlight, subsequent encounter: Secondary | ICD-10-CM | POA: Diagnosis not present

## 2016-06-12 DIAGNOSIS — L570 Actinic keratosis: Secondary | ICD-10-CM | POA: Diagnosis not present

## 2016-06-19 DIAGNOSIS — E782 Mixed hyperlipidemia: Secondary | ICD-10-CM | POA: Diagnosis not present

## 2016-06-19 DIAGNOSIS — E039 Hypothyroidism, unspecified: Secondary | ICD-10-CM | POA: Diagnosis not present

## 2016-07-31 DIAGNOSIS — Z85828 Personal history of other malignant neoplasm of skin: Secondary | ICD-10-CM | POA: Diagnosis not present

## 2016-07-31 DIAGNOSIS — C44622 Squamous cell carcinoma of skin of right upper limb, including shoulder: Secondary | ICD-10-CM | POA: Diagnosis not present

## 2016-07-31 DIAGNOSIS — Z08 Encounter for follow-up examination after completed treatment for malignant neoplasm: Secondary | ICD-10-CM | POA: Diagnosis not present

## 2016-08-07 DIAGNOSIS — M4726 Other spondylosis with radiculopathy, lumbar region: Secondary | ICD-10-CM | POA: Diagnosis not present

## 2016-08-07 DIAGNOSIS — M47816 Spondylosis without myelopathy or radiculopathy, lumbar region: Secondary | ICD-10-CM | POA: Diagnosis not present

## 2016-08-11 DIAGNOSIS — M4726 Other spondylosis with radiculopathy, lumbar region: Secondary | ICD-10-CM | POA: Diagnosis not present

## 2016-08-11 DIAGNOSIS — M47816 Spondylosis without myelopathy or radiculopathy, lumbar region: Secondary | ICD-10-CM | POA: Diagnosis not present

## 2016-09-04 DIAGNOSIS — Z85828 Personal history of other malignant neoplasm of skin: Secondary | ICD-10-CM | POA: Diagnosis not present

## 2016-09-04 DIAGNOSIS — B078 Other viral warts: Secondary | ICD-10-CM | POA: Diagnosis not present

## 2016-09-04 DIAGNOSIS — Z08 Encounter for follow-up examination after completed treatment for malignant neoplasm: Secondary | ICD-10-CM | POA: Diagnosis not present

## 2016-11-19 ENCOUNTER — Other Ambulatory Visit: Payer: Self-pay | Admitting: Family Medicine

## 2016-11-19 DIAGNOSIS — Z139 Encounter for screening, unspecified: Secondary | ICD-10-CM

## 2016-11-24 DIAGNOSIS — R635 Abnormal weight gain: Secondary | ICD-10-CM | POA: Diagnosis not present

## 2016-11-24 DIAGNOSIS — E782 Mixed hyperlipidemia: Secondary | ICD-10-CM | POA: Diagnosis not present

## 2016-11-24 DIAGNOSIS — E039 Hypothyroidism, unspecified: Secondary | ICD-10-CM | POA: Diagnosis not present

## 2016-11-24 DIAGNOSIS — Z0000 Encounter for general adult medical examination without abnormal findings: Secondary | ICD-10-CM | POA: Diagnosis not present

## 2016-12-05 DIAGNOSIS — R05 Cough: Secondary | ICD-10-CM | POA: Diagnosis not present

## 2016-12-05 DIAGNOSIS — J0100 Acute maxillary sinusitis, unspecified: Secondary | ICD-10-CM | POA: Diagnosis not present

## 2016-12-05 DIAGNOSIS — H938X3 Other specified disorders of ear, bilateral: Secondary | ICD-10-CM | POA: Diagnosis not present

## 2016-12-22 DIAGNOSIS — E782 Mixed hyperlipidemia: Secondary | ICD-10-CM | POA: Diagnosis not present

## 2016-12-22 DIAGNOSIS — E039 Hypothyroidism, unspecified: Secondary | ICD-10-CM | POA: Diagnosis not present

## 2016-12-22 DIAGNOSIS — M8580 Other specified disorders of bone density and structure, unspecified site: Secondary | ICD-10-CM | POA: Diagnosis not present

## 2016-12-22 DIAGNOSIS — R7301 Impaired fasting glucose: Secondary | ICD-10-CM | POA: Diagnosis not present

## 2016-12-22 DIAGNOSIS — E559 Vitamin D deficiency, unspecified: Secondary | ICD-10-CM | POA: Diagnosis not present

## 2016-12-25 ENCOUNTER — Encounter (INDEPENDENT_AMBULATORY_CARE_PROVIDER_SITE_OTHER): Payer: BLUE CROSS/BLUE SHIELD | Admitting: Ophthalmology

## 2016-12-25 DIAGNOSIS — H33301 Unspecified retinal break, right eye: Secondary | ICD-10-CM | POA: Diagnosis not present

## 2016-12-25 DIAGNOSIS — H43813 Vitreous degeneration, bilateral: Secondary | ICD-10-CM

## 2016-12-25 DIAGNOSIS — H2513 Age-related nuclear cataract, bilateral: Secondary | ICD-10-CM | POA: Diagnosis not present

## 2016-12-26 ENCOUNTER — Ambulatory Visit: Payer: BLUE CROSS/BLUE SHIELD

## 2017-01-05 ENCOUNTER — Encounter (INDEPENDENT_AMBULATORY_CARE_PROVIDER_SITE_OTHER): Payer: BLUE CROSS/BLUE SHIELD | Admitting: Ophthalmology

## 2017-01-05 DIAGNOSIS — H33301 Unspecified retinal break, right eye: Secondary | ICD-10-CM

## 2017-01-06 IMAGING — MG DIGITAL DIAGNOSTIC UNILATERAL RIGHT MAMMOGRAM
6 series · 6 of 6 positions shown · non-contrast
Comparison: Previous exam(s).

CLINICAL DATA: Patient was called back from screening mammogram for
right breast calcifications.

EXAM:
DIGITAL DIAGNOSTIC RIGHT MAMMOGRAM WITH CAD

[R CC (1 of 2)]
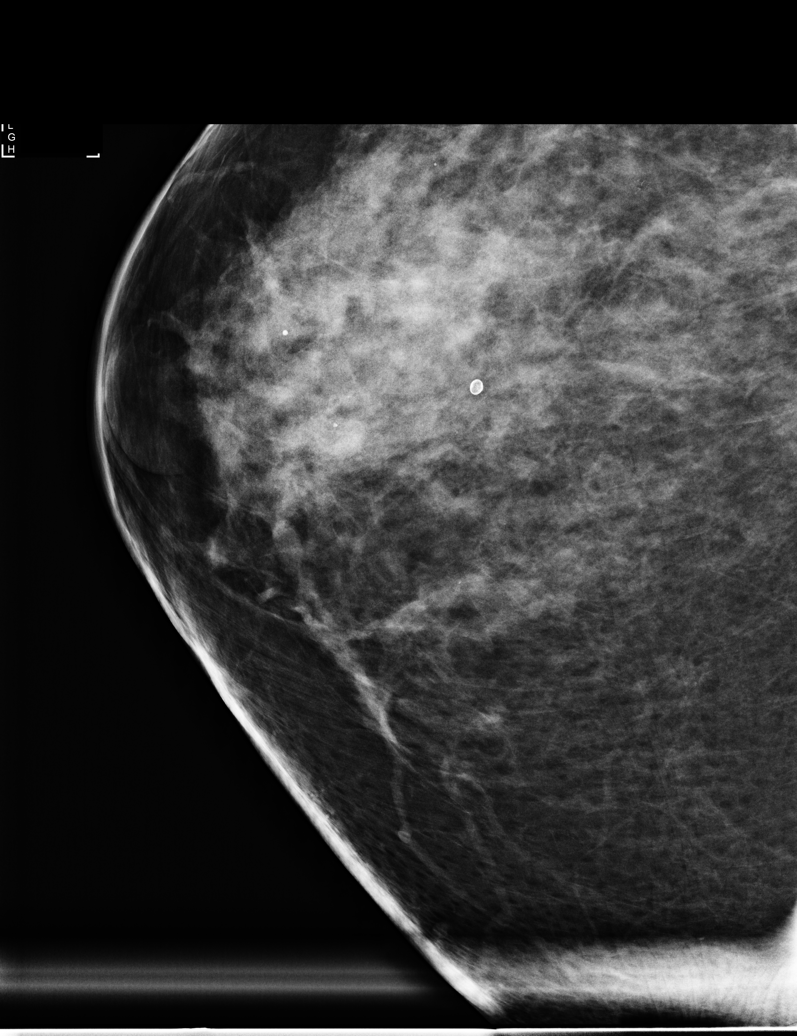

[R ML (1 of 2)]
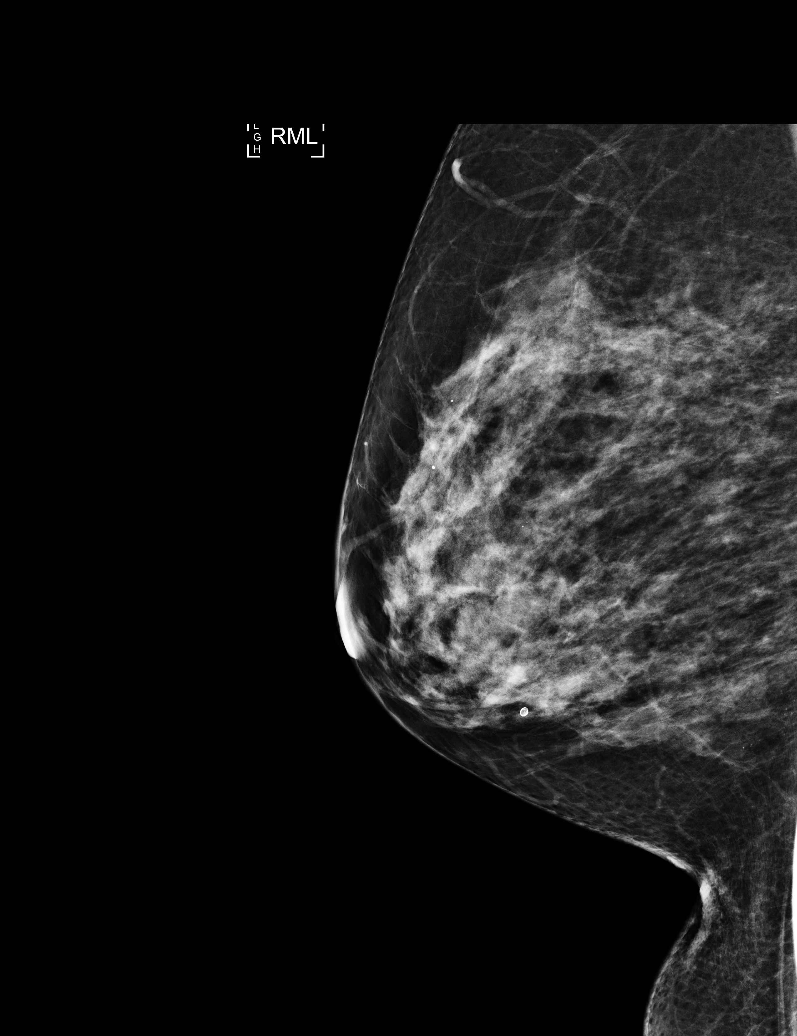

[R ML (2 of 2)]
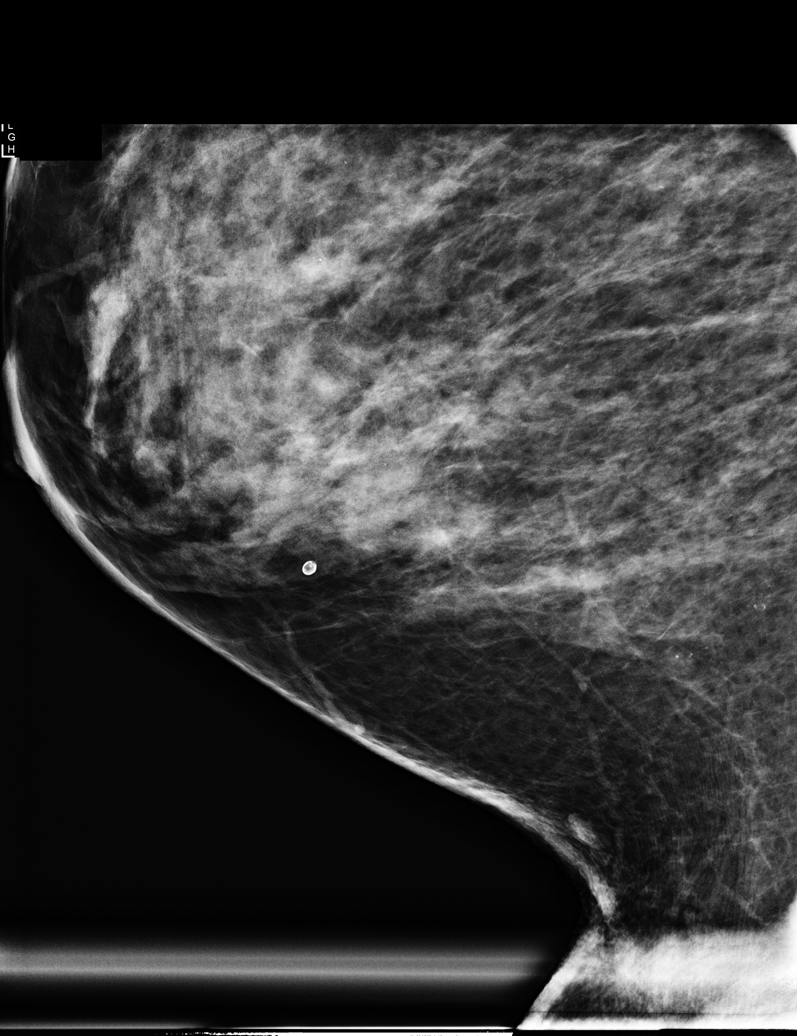

[R XCCL (1 of 2)]
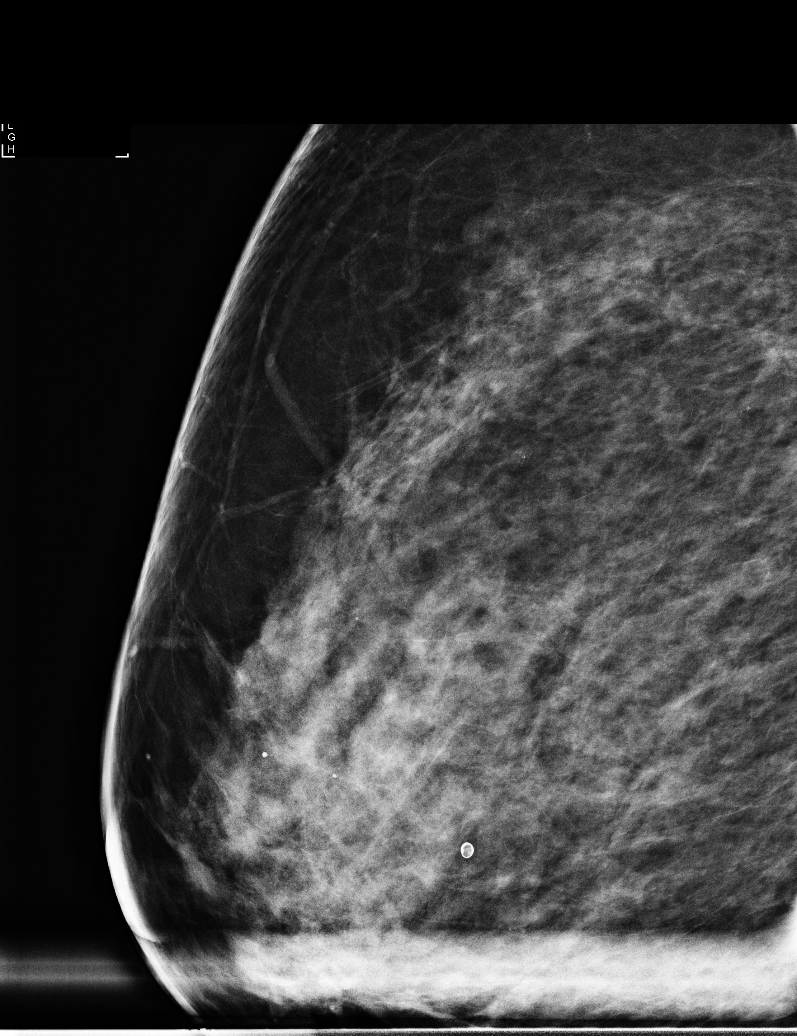

[R CC (2 of 2)]
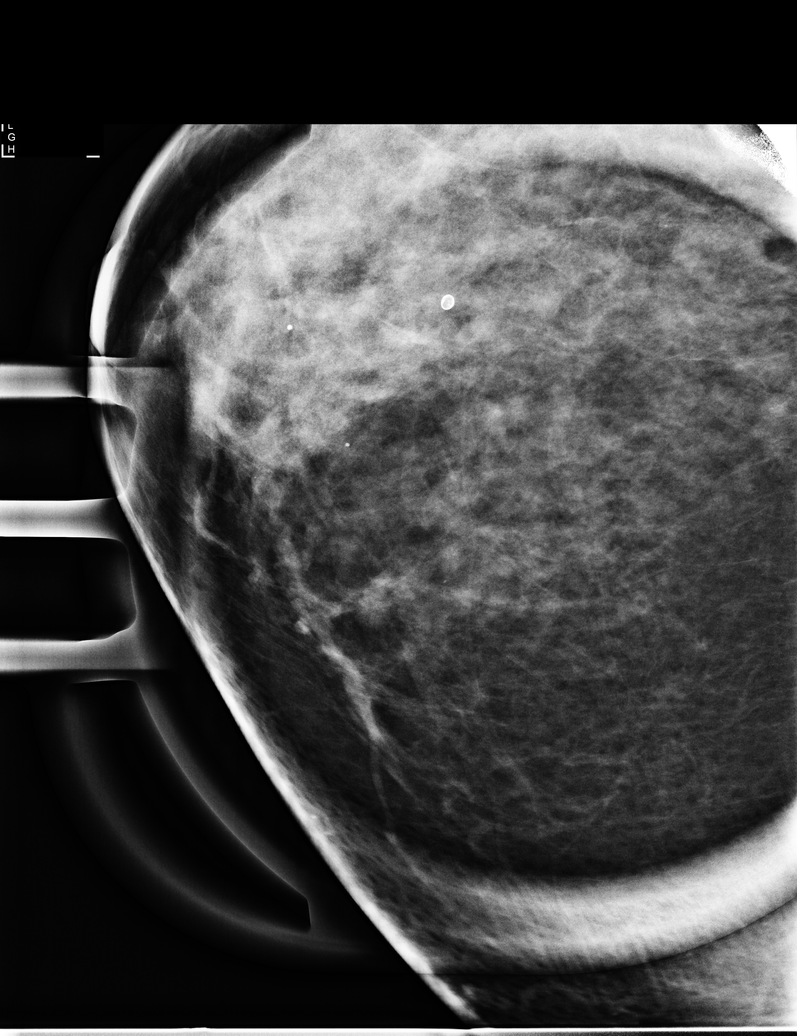

[R XCCL (2 of 2)]
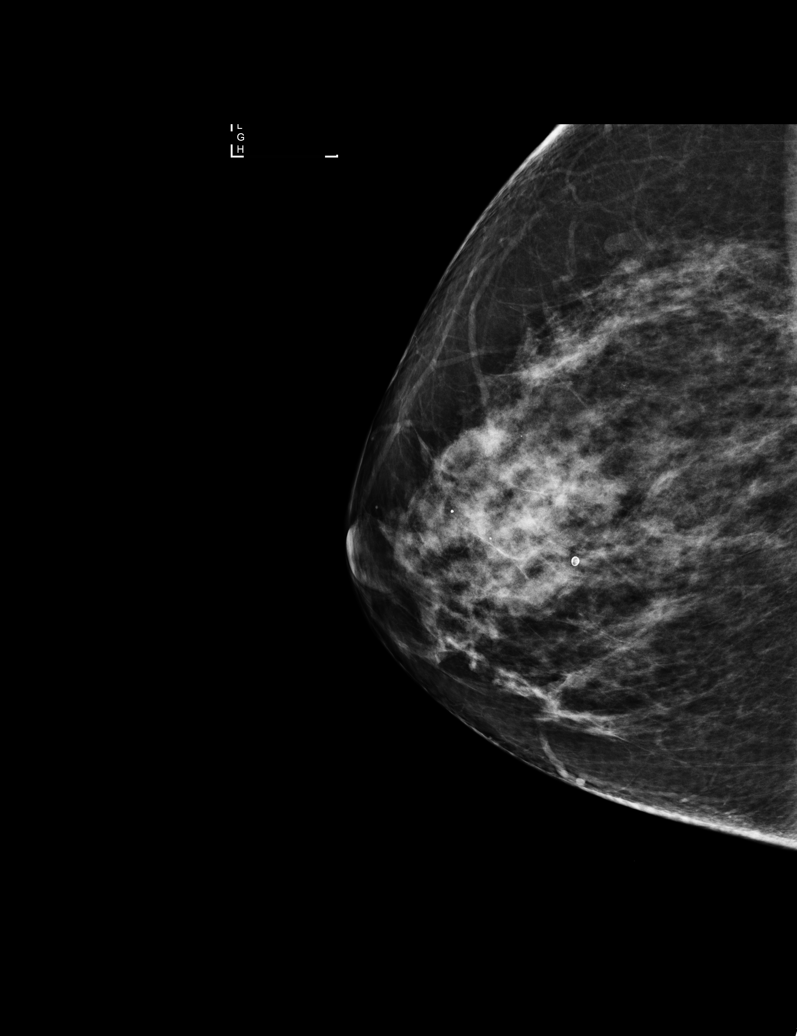

[6 of 6 positions shown; findings below may reference images not displayed]

ACR Breast Density Category c: The breast tissue is heterogeneously
dense, which may obscure small masses.
FINDINGS: Additional imaging of the right breast was performed. There are
developing loosely grouped calcifications in the inferior aspect of
the right breast spanning 1.5 cm. They are seen on the lateral
views. There is no suspicious mass.

Mammographic images were processed with CAD.
IMPRESSION: Indeterminate right breast calcifications.

RECOMMENDATION:
Stereotactic biopsy of the right breast calcifications is
recommended. The stereotactic biopsy has been scheduled on
12/21/2015.

I have discussed the findings and recommendations with the patient.
Results were also provided in writing at the conclusion of the
visit. If applicable, a reminder letter will be sent to the patient
regarding the next appointment.

BI-RADS CATEGORY  4: Suspicious.

## 2017-01-22 DIAGNOSIS — M4726 Other spondylosis with radiculopathy, lumbar region: Secondary | ICD-10-CM | POA: Diagnosis not present

## 2017-01-22 DIAGNOSIS — M47816 Spondylosis without myelopathy or radiculopathy, lumbar region: Secondary | ICD-10-CM | POA: Diagnosis not present

## 2017-01-23 DIAGNOSIS — M47816 Spondylosis without myelopathy or radiculopathy, lumbar region: Secondary | ICD-10-CM | POA: Diagnosis not present

## 2017-01-23 DIAGNOSIS — M4726 Other spondylosis with radiculopathy, lumbar region: Secondary | ICD-10-CM | POA: Diagnosis not present

## 2017-01-26 DIAGNOSIS — M4726 Other spondylosis with radiculopathy, lumbar region: Secondary | ICD-10-CM | POA: Diagnosis not present

## 2017-01-26 DIAGNOSIS — M47816 Spondylosis without myelopathy or radiculopathy, lumbar region: Secondary | ICD-10-CM | POA: Diagnosis not present

## 2017-01-28 ENCOUNTER — Ambulatory Visit
Admission: RE | Admit: 2017-01-28 | Discharge: 2017-01-28 | Disposition: A | Discharge status: Home / Self Care | Payer: BLUE CROSS/BLUE SHIELD | Source: Ambulatory Visit | Attending: Family Medicine | Admitting: Family Medicine

## 2017-01-28 DIAGNOSIS — Z139 Encounter for screening, unspecified: Secondary | ICD-10-CM

## 2017-01-28 DIAGNOSIS — M8588 Other specified disorders of bone density and structure, other site: Secondary | ICD-10-CM | POA: Diagnosis not present

## 2017-01-28 DIAGNOSIS — Z1231 Encounter for screening mammogram for malignant neoplasm of breast: Secondary | ICD-10-CM | POA: Diagnosis not present

## 2017-01-29 DIAGNOSIS — M4726 Other spondylosis with radiculopathy, lumbar region: Secondary | ICD-10-CM | POA: Diagnosis not present

## 2017-01-29 DIAGNOSIS — M47816 Spondylosis without myelopathy or radiculopathy, lumbar region: Secondary | ICD-10-CM | POA: Diagnosis not present

## 2017-02-02 DIAGNOSIS — M4726 Other spondylosis with radiculopathy, lumbar region: Secondary | ICD-10-CM | POA: Diagnosis not present

## 2017-02-02 DIAGNOSIS — M47816 Spondylosis without myelopathy or radiculopathy, lumbar region: Secondary | ICD-10-CM | POA: Diagnosis not present

## 2017-02-05 DIAGNOSIS — M4726 Other spondylosis with radiculopathy, lumbar region: Secondary | ICD-10-CM | POA: Diagnosis not present

## 2017-02-05 DIAGNOSIS — M47816 Spondylosis without myelopathy or radiculopathy, lumbar region: Secondary | ICD-10-CM | POA: Diagnosis not present

## 2017-02-10 DIAGNOSIS — M47816 Spondylosis without myelopathy or radiculopathy, lumbar region: Secondary | ICD-10-CM | POA: Diagnosis not present

## 2017-02-10 DIAGNOSIS — M4726 Other spondylosis with radiculopathy, lumbar region: Secondary | ICD-10-CM | POA: Diagnosis not present

## 2017-02-13 DIAGNOSIS — M4726 Other spondylosis with radiculopathy, lumbar region: Secondary | ICD-10-CM | POA: Diagnosis not present

## 2017-02-13 DIAGNOSIS — M47816 Spondylosis without myelopathy or radiculopathy, lumbar region: Secondary | ICD-10-CM | POA: Diagnosis not present

## 2017-02-17 DIAGNOSIS — M47816 Spondylosis without myelopathy or radiculopathy, lumbar region: Secondary | ICD-10-CM | POA: Diagnosis not present

## 2017-02-17 DIAGNOSIS — M4726 Other spondylosis with radiculopathy, lumbar region: Secondary | ICD-10-CM | POA: Diagnosis not present

## 2017-02-24 DIAGNOSIS — M4726 Other spondylosis with radiculopathy, lumbar region: Secondary | ICD-10-CM | POA: Diagnosis not present

## 2017-02-24 DIAGNOSIS — M47816 Spondylosis without myelopathy or radiculopathy, lumbar region: Secondary | ICD-10-CM | POA: Diagnosis not present

## 2017-02-27 DIAGNOSIS — M4726 Other spondylosis with radiculopathy, lumbar region: Secondary | ICD-10-CM | POA: Diagnosis not present

## 2017-02-27 DIAGNOSIS — M47816 Spondylosis without myelopathy or radiculopathy, lumbar region: Secondary | ICD-10-CM | POA: Diagnosis not present

## 2017-03-02 DIAGNOSIS — M4726 Other spondylosis with radiculopathy, lumbar region: Secondary | ICD-10-CM | POA: Diagnosis not present

## 2017-03-02 DIAGNOSIS — M47816 Spondylosis without myelopathy or radiculopathy, lumbar region: Secondary | ICD-10-CM | POA: Diagnosis not present

## 2017-03-03 DIAGNOSIS — Z86010 Personal history of colonic polyps: Secondary | ICD-10-CM | POA: Diagnosis not present

## 2017-03-03 DIAGNOSIS — K621 Rectal polyp: Secondary | ICD-10-CM | POA: Diagnosis not present

## 2017-03-03 DIAGNOSIS — K640 First degree hemorrhoids: Secondary | ICD-10-CM | POA: Diagnosis not present

## 2017-03-03 DIAGNOSIS — K5730 Diverticulosis of large intestine without perforation or abscess without bleeding: Secondary | ICD-10-CM | POA: Diagnosis not present

## 2017-03-05 DIAGNOSIS — M4726 Other spondylosis with radiculopathy, lumbar region: Secondary | ICD-10-CM | POA: Diagnosis not present

## 2017-03-05 DIAGNOSIS — M79675 Pain in left toe(s): Secondary | ICD-10-CM | POA: Diagnosis not present

## 2017-03-05 DIAGNOSIS — M47816 Spondylosis without myelopathy or radiculopathy, lumbar region: Secondary | ICD-10-CM | POA: Diagnosis not present

## 2017-03-05 DIAGNOSIS — M79672 Pain in left foot: Secondary | ICD-10-CM | POA: Diagnosis not present

## 2017-03-05 DIAGNOSIS — S93332D Other subluxation of left foot, subsequent encounter: Secondary | ICD-10-CM | POA: Diagnosis not present

## 2017-03-06 DIAGNOSIS — M4726 Other spondylosis with radiculopathy, lumbar region: Secondary | ICD-10-CM | POA: Diagnosis not present

## 2017-03-06 DIAGNOSIS — K621 Rectal polyp: Secondary | ICD-10-CM | POA: Diagnosis not present

## 2017-03-06 DIAGNOSIS — M47816 Spondylosis without myelopathy or radiculopathy, lumbar region: Secondary | ICD-10-CM | POA: Diagnosis not present

## 2017-03-09 DIAGNOSIS — M4726 Other spondylosis with radiculopathy, lumbar region: Secondary | ICD-10-CM | POA: Diagnosis not present

## 2017-03-09 DIAGNOSIS — M47816 Spondylosis without myelopathy or radiculopathy, lumbar region: Secondary | ICD-10-CM | POA: Diagnosis not present

## 2017-03-10 DIAGNOSIS — M47816 Spondylosis without myelopathy or radiculopathy, lumbar region: Secondary | ICD-10-CM | POA: Diagnosis not present

## 2017-03-10 DIAGNOSIS — M4726 Other spondylosis with radiculopathy, lumbar region: Secondary | ICD-10-CM | POA: Diagnosis not present

## 2017-03-13 DIAGNOSIS — M47816 Spondylosis without myelopathy or radiculopathy, lumbar region: Secondary | ICD-10-CM | POA: Diagnosis not present

## 2017-03-13 DIAGNOSIS — M4726 Other spondylosis with radiculopathy, lumbar region: Secondary | ICD-10-CM | POA: Diagnosis not present

## 2017-03-17 DIAGNOSIS — M47816 Spondylosis without myelopathy or radiculopathy, lumbar region: Secondary | ICD-10-CM | POA: Diagnosis not present

## 2017-03-17 DIAGNOSIS — M4726 Other spondylosis with radiculopathy, lumbar region: Secondary | ICD-10-CM | POA: Diagnosis not present

## 2017-03-19 DIAGNOSIS — M4726 Other spondylosis with radiculopathy, lumbar region: Secondary | ICD-10-CM | POA: Diagnosis not present

## 2017-03-19 DIAGNOSIS — M47816 Spondylosis without myelopathy or radiculopathy, lumbar region: Secondary | ICD-10-CM | POA: Diagnosis not present

## 2017-05-06 ENCOUNTER — Encounter (INDEPENDENT_AMBULATORY_CARE_PROVIDER_SITE_OTHER): Payer: BLUE CROSS/BLUE SHIELD | Admitting: Ophthalmology

## 2017-05-06 DIAGNOSIS — H33301 Unspecified retinal break, right eye: Secondary | ICD-10-CM | POA: Diagnosis not present

## 2017-05-06 DIAGNOSIS — H2513 Age-related nuclear cataract, bilateral: Secondary | ICD-10-CM

## 2017-05-06 DIAGNOSIS — H43813 Vitreous degeneration, bilateral: Secondary | ICD-10-CM | POA: Diagnosis not present

## 2017-05-27 DIAGNOSIS — R062 Wheezing: Secondary | ICD-10-CM | POA: Diagnosis not present

## 2017-05-27 DIAGNOSIS — Z6832 Body mass index (BMI) 32.0-32.9, adult: Secondary | ICD-10-CM | POA: Diagnosis not present

## 2017-05-27 DIAGNOSIS — J209 Acute bronchitis, unspecified: Secondary | ICD-10-CM | POA: Diagnosis not present

## 2017-05-28 DIAGNOSIS — M25551 Pain in right hip: Secondary | ICD-10-CM | POA: Diagnosis not present

## 2017-06-08 ENCOUNTER — Encounter (INDEPENDENT_AMBULATORY_CARE_PROVIDER_SITE_OTHER): Payer: BLUE CROSS/BLUE SHIELD | Admitting: Ophthalmology

## 2017-06-08 DIAGNOSIS — H2513 Age-related nuclear cataract, bilateral: Secondary | ICD-10-CM | POA: Diagnosis not present

## 2017-06-08 DIAGNOSIS — H43813 Vitreous degeneration, bilateral: Secondary | ICD-10-CM

## 2017-06-08 DIAGNOSIS — H33301 Unspecified retinal break, right eye: Secondary | ICD-10-CM | POA: Diagnosis not present

## 2017-06-17 DIAGNOSIS — R7303 Prediabetes: Secondary | ICD-10-CM | POA: Diagnosis not present

## 2017-06-17 DIAGNOSIS — J189 Pneumonia, unspecified organism: Secondary | ICD-10-CM | POA: Diagnosis not present

## 2017-06-17 DIAGNOSIS — R011 Cardiac murmur, unspecified: Secondary | ICD-10-CM | POA: Diagnosis not present

## 2017-06-17 DIAGNOSIS — E039 Hypothyroidism, unspecified: Secondary | ICD-10-CM | POA: Diagnosis not present

## 2017-06-17 DIAGNOSIS — M1611 Unilateral primary osteoarthritis, right hip: Secondary | ICD-10-CM | POA: Diagnosis not present

## 2017-06-23 DIAGNOSIS — Z01812 Encounter for preprocedural laboratory examination: Secondary | ICD-10-CM | POA: Diagnosis not present

## 2017-06-23 DIAGNOSIS — Z01818 Encounter for other preprocedural examination: Secondary | ICD-10-CM | POA: Diagnosis not present

## 2017-06-23 DIAGNOSIS — M1611 Unilateral primary osteoarthritis, right hip: Secondary | ICD-10-CM | POA: Diagnosis not present

## 2017-06-23 DIAGNOSIS — R011 Cardiac murmur, unspecified: Secondary | ICD-10-CM | POA: Diagnosis not present

## 2017-06-23 DIAGNOSIS — E785 Hyperlipidemia, unspecified: Secondary | ICD-10-CM | POA: Diagnosis not present

## 2017-06-25 DIAGNOSIS — M25551 Pain in right hip: Secondary | ICD-10-CM | POA: Diagnosis not present

## 2017-06-25 DIAGNOSIS — R011 Cardiac murmur, unspecified: Secondary | ICD-10-CM | POA: Diagnosis not present

## 2017-06-25 DIAGNOSIS — Z01818 Encounter for other preprocedural examination: Secondary | ICD-10-CM | POA: Diagnosis not present

## 2017-07-06 DIAGNOSIS — M25551 Pain in right hip: Secondary | ICD-10-CM | POA: Diagnosis not present

## 2017-07-06 DIAGNOSIS — Z471 Aftercare following joint replacement surgery: Secondary | ICD-10-CM | POA: Diagnosis not present

## 2017-07-06 DIAGNOSIS — Z881 Allergy status to other antibiotic agents status: Secondary | ICD-10-CM | POA: Diagnosis not present

## 2017-07-06 DIAGNOSIS — M1611 Unilateral primary osteoarthritis, right hip: Secondary | ICD-10-CM | POA: Diagnosis not present

## 2017-07-06 DIAGNOSIS — E079 Disorder of thyroid, unspecified: Secondary | ICD-10-CM | POA: Diagnosis not present

## 2017-07-06 DIAGNOSIS — Z96641 Presence of right artificial hip joint: Secondary | ICD-10-CM | POA: Diagnosis not present

## 2017-07-06 DIAGNOSIS — Z79899 Other long term (current) drug therapy: Secondary | ICD-10-CM | POA: Diagnosis not present

## 2017-07-06 DIAGNOSIS — Z87891 Personal history of nicotine dependence: Secondary | ICD-10-CM | POA: Diagnosis not present

## 2017-07-06 DIAGNOSIS — M1990 Unspecified osteoarthritis, unspecified site: Secondary | ICD-10-CM | POA: Diagnosis not present

## 2017-07-06 DIAGNOSIS — E785 Hyperlipidemia, unspecified: Secondary | ICD-10-CM | POA: Diagnosis not present

## 2017-08-12 DIAGNOSIS — M25551 Pain in right hip: Secondary | ICD-10-CM | POA: Diagnosis not present

## 2017-10-05 DIAGNOSIS — Q211 Atrial septal defect: Secondary | ICD-10-CM | POA: Diagnosis not present

## 2017-10-05 DIAGNOSIS — R29818 Other symptoms and signs involving the nervous system: Secondary | ICD-10-CM | POA: Diagnosis not present

## 2017-10-05 DIAGNOSIS — R011 Cardiac murmur, unspecified: Secondary | ICD-10-CM | POA: Diagnosis not present

## 2017-10-05 DIAGNOSIS — Z0189 Encounter for other specified special examinations: Secondary | ICD-10-CM | POA: Diagnosis not present

## 2017-10-20 DIAGNOSIS — M8588 Other specified disorders of bone density and structure, other site: Secondary | ICD-10-CM | POA: Diagnosis not present

## 2017-10-20 DIAGNOSIS — R400 Somnolence: Secondary | ICD-10-CM | POA: Diagnosis not present

## 2017-10-20 DIAGNOSIS — Z23 Encounter for immunization: Secondary | ICD-10-CM | POA: Diagnosis not present

## 2017-10-20 DIAGNOSIS — R5383 Other fatigue: Secondary | ICD-10-CM | POA: Diagnosis not present

## 2017-11-05 DIAGNOSIS — D225 Melanocytic nevi of trunk: Secondary | ICD-10-CM | POA: Diagnosis not present

## 2017-11-05 DIAGNOSIS — L821 Other seborrheic keratosis: Secondary | ICD-10-CM | POA: Diagnosis not present

## 2017-11-05 DIAGNOSIS — D485 Neoplasm of uncertain behavior of skin: Secondary | ICD-10-CM | POA: Diagnosis not present

## 2017-11-05 DIAGNOSIS — L820 Inflamed seborrheic keratosis: Secondary | ICD-10-CM | POA: Diagnosis not present

## 2017-11-23 DIAGNOSIS — E782 Mixed hyperlipidemia: Secondary | ICD-10-CM | POA: Diagnosis not present

## 2017-11-23 DIAGNOSIS — E039 Hypothyroidism, unspecified: Secondary | ICD-10-CM | POA: Diagnosis not present

## 2017-11-27 ENCOUNTER — Other Ambulatory Visit (HOSPITAL_COMMUNITY)
Admission: RE | Admit: 2017-11-27 | Discharge: 2017-11-27 | Disposition: A | Discharge status: Home / Self Care | Payer: BLUE CROSS/BLUE SHIELD | Source: Ambulatory Visit | Attending: Family Medicine | Admitting: Family Medicine

## 2017-11-27 ENCOUNTER — Other Ambulatory Visit: Payer: Self-pay | Admitting: Family Medicine

## 2017-11-27 DIAGNOSIS — M8580 Other specified disorders of bone density and structure, unspecified site: Secondary | ICD-10-CM | POA: Diagnosis not present

## 2017-11-27 DIAGNOSIS — Z124 Encounter for screening for malignant neoplasm of cervix: Secondary | ICD-10-CM | POA: Insufficient documentation

## 2017-11-27 DIAGNOSIS — Z0000 Encounter for general adult medical examination without abnormal findings: Secondary | ICD-10-CM | POA: Diagnosis not present

## 2017-11-27 DIAGNOSIS — E039 Hypothyroidism, unspecified: Secondary | ICD-10-CM | POA: Diagnosis not present

## 2017-11-27 DIAGNOSIS — R7303 Prediabetes: Secondary | ICD-10-CM | POA: Diagnosis not present

## 2017-11-27 DIAGNOSIS — Z23 Encounter for immunization: Secondary | ICD-10-CM | POA: Diagnosis not present

## 2017-11-27 DIAGNOSIS — E782 Mixed hyperlipidemia: Secondary | ICD-10-CM | POA: Diagnosis not present

## 2017-12-01 LAB — CYTOLOGY - PAP: Diagnosis: NEGATIVE

## 2017-12-08 DIAGNOSIS — J45901 Unspecified asthma with (acute) exacerbation: Secondary | ICD-10-CM | POA: Diagnosis not present

## 2017-12-22 DIAGNOSIS — M25551 Pain in right hip: Secondary | ICD-10-CM | POA: Diagnosis not present

## 2017-12-23 ENCOUNTER — Other Ambulatory Visit: Payer: Self-pay | Admitting: Family Medicine

## 2017-12-23 DIAGNOSIS — Z1231 Encounter for screening mammogram for malignant neoplasm of breast: Secondary | ICD-10-CM

## 2018-01-06 DIAGNOSIS — R319 Hematuria, unspecified: Secondary | ICD-10-CM | POA: Diagnosis not present

## 2018-01-06 DIAGNOSIS — Z6832 Body mass index (BMI) 32.0-32.9, adult: Secondary | ICD-10-CM | POA: Diagnosis not present

## 2018-01-06 DIAGNOSIS — R300 Dysuria: Secondary | ICD-10-CM | POA: Diagnosis not present

## 2018-01-06 DIAGNOSIS — N390 Urinary tract infection, site not specified: Secondary | ICD-10-CM | POA: Diagnosis not present

## 2018-01-12 DIAGNOSIS — M25552 Pain in left hip: Secondary | ICD-10-CM | POA: Diagnosis not present

## 2018-01-22 DIAGNOSIS — M25552 Pain in left hip: Secondary | ICD-10-CM | POA: Diagnosis not present

## 2018-01-28 DIAGNOSIS — M25552 Pain in left hip: Secondary | ICD-10-CM | POA: Diagnosis not present

## 2018-02-04 ENCOUNTER — Ambulatory Visit
Admission: RE | Admit: 2018-02-04 | Discharge: 2018-02-04 | Disposition: A | Discharge status: Home / Self Care | Payer: BLUE CROSS/BLUE SHIELD | Source: Ambulatory Visit | Attending: Family Medicine | Admitting: Family Medicine

## 2018-02-04 DIAGNOSIS — Z1231 Encounter for screening mammogram for malignant neoplasm of breast: Secondary | ICD-10-CM | POA: Diagnosis not present

## 2018-02-18 DIAGNOSIS — R011 Cardiac murmur, unspecified: Secondary | ICD-10-CM | POA: Diagnosis not present

## 2018-02-18 DIAGNOSIS — Z01818 Encounter for other preprocedural examination: Secondary | ICD-10-CM | POA: Diagnosis not present

## 2018-02-18 DIAGNOSIS — M1612 Unilateral primary osteoarthritis, left hip: Secondary | ICD-10-CM | POA: Diagnosis not present

## 2018-02-18 DIAGNOSIS — E785 Hyperlipidemia, unspecified: Secondary | ICD-10-CM | POA: Diagnosis not present

## 2018-02-28 IMAGING — MG DIGITAL SCREENING BILATERAL MAMMOGRAM WITH CAD
4 series · 4 of 4 positions shown · non-contrast
Comparison: Previous exam(s).

CLINICAL DATA: Screening.

EXAM:
DIGITAL SCREENING BILATERAL MAMMOGRAM WITH CAD

[R CC]
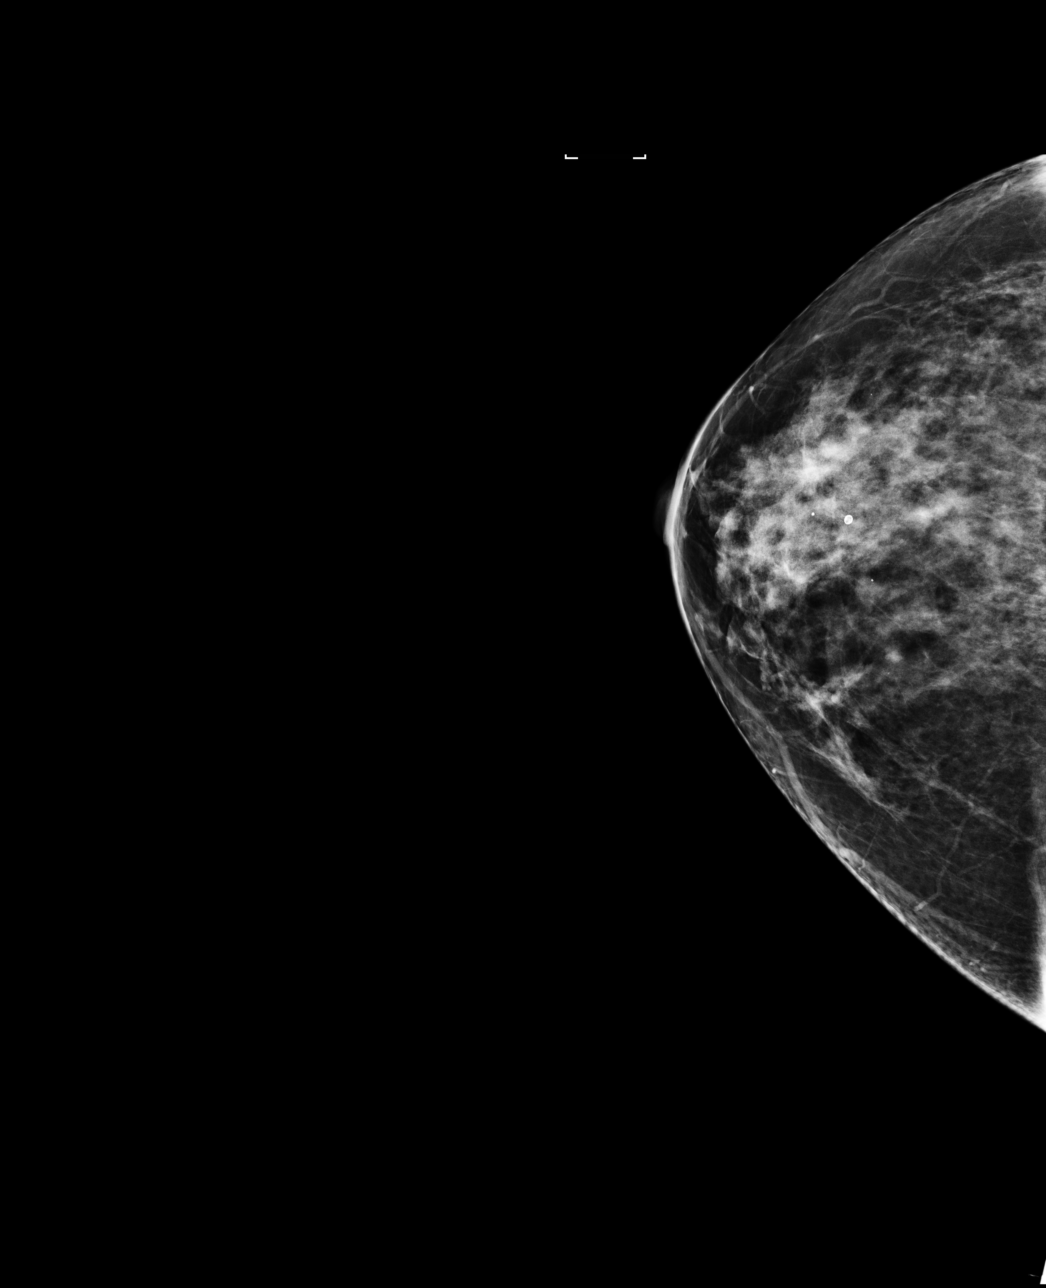

[L MLO]
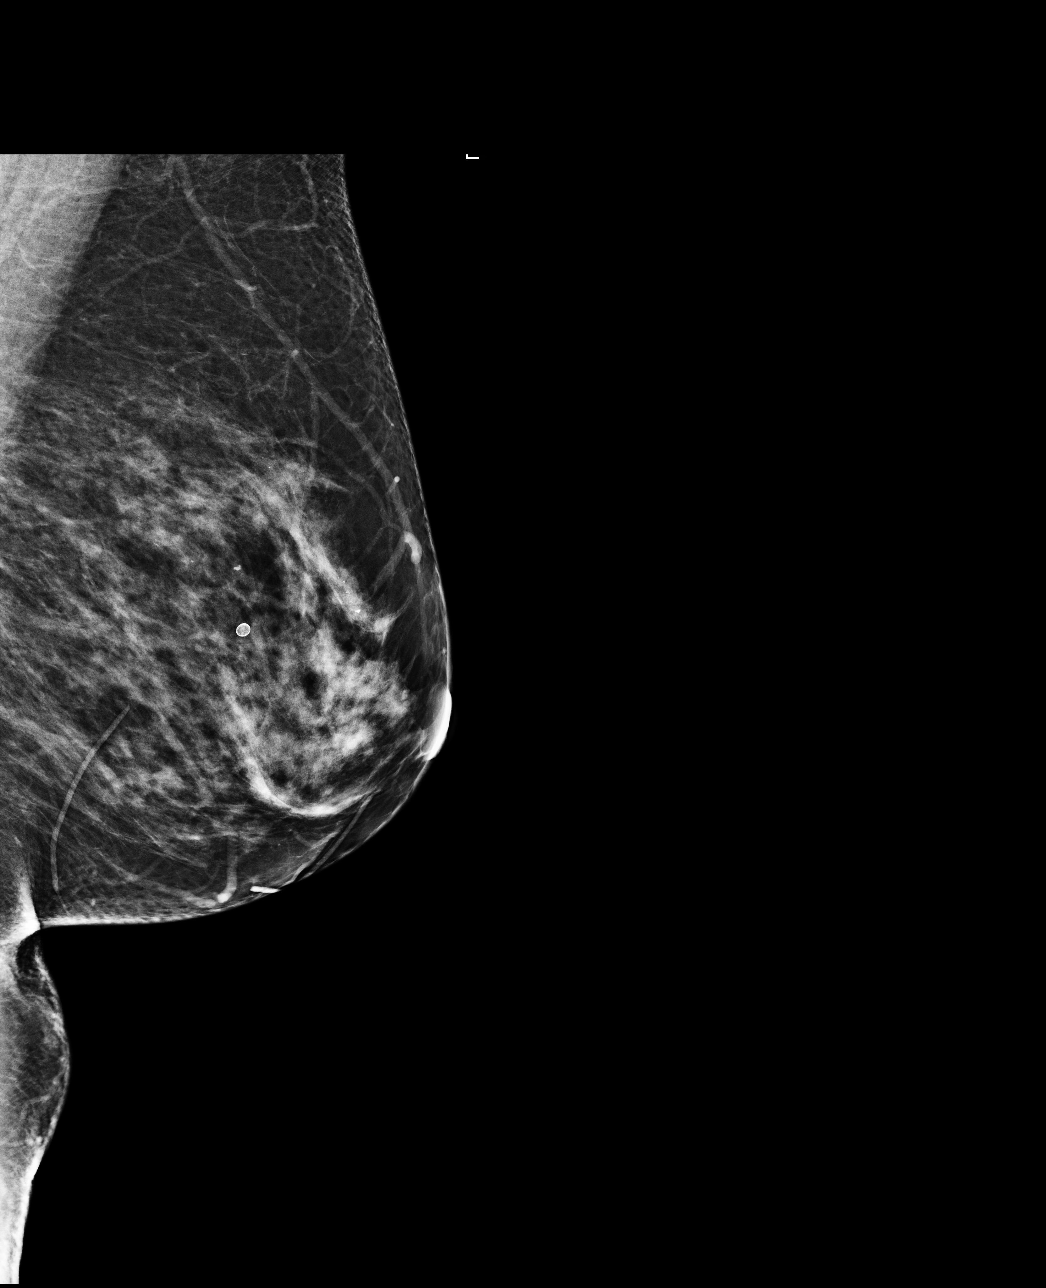

[R MLO]
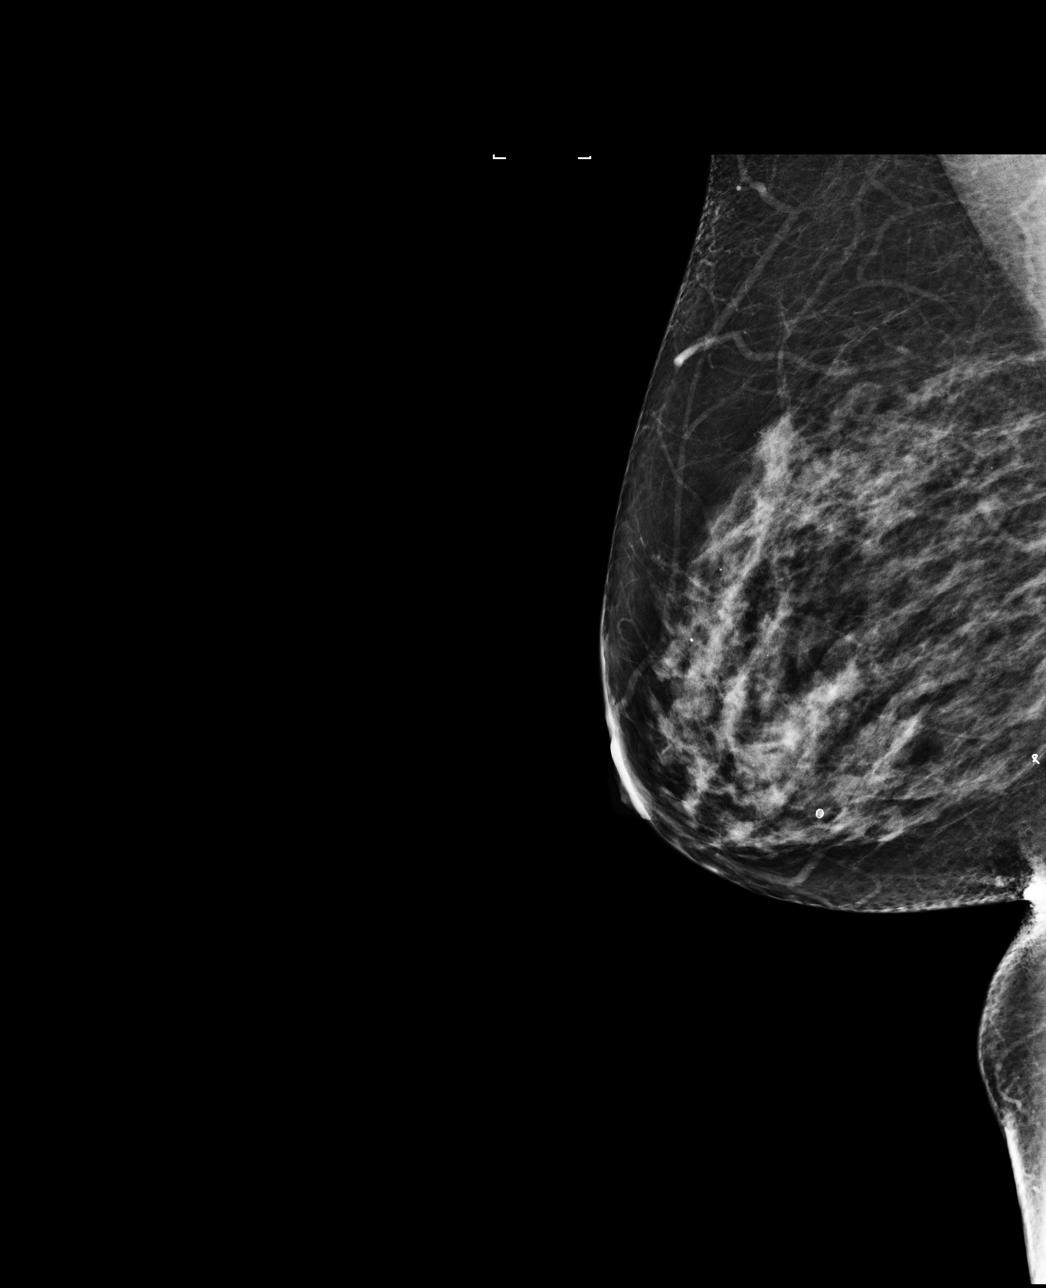

[L CC]
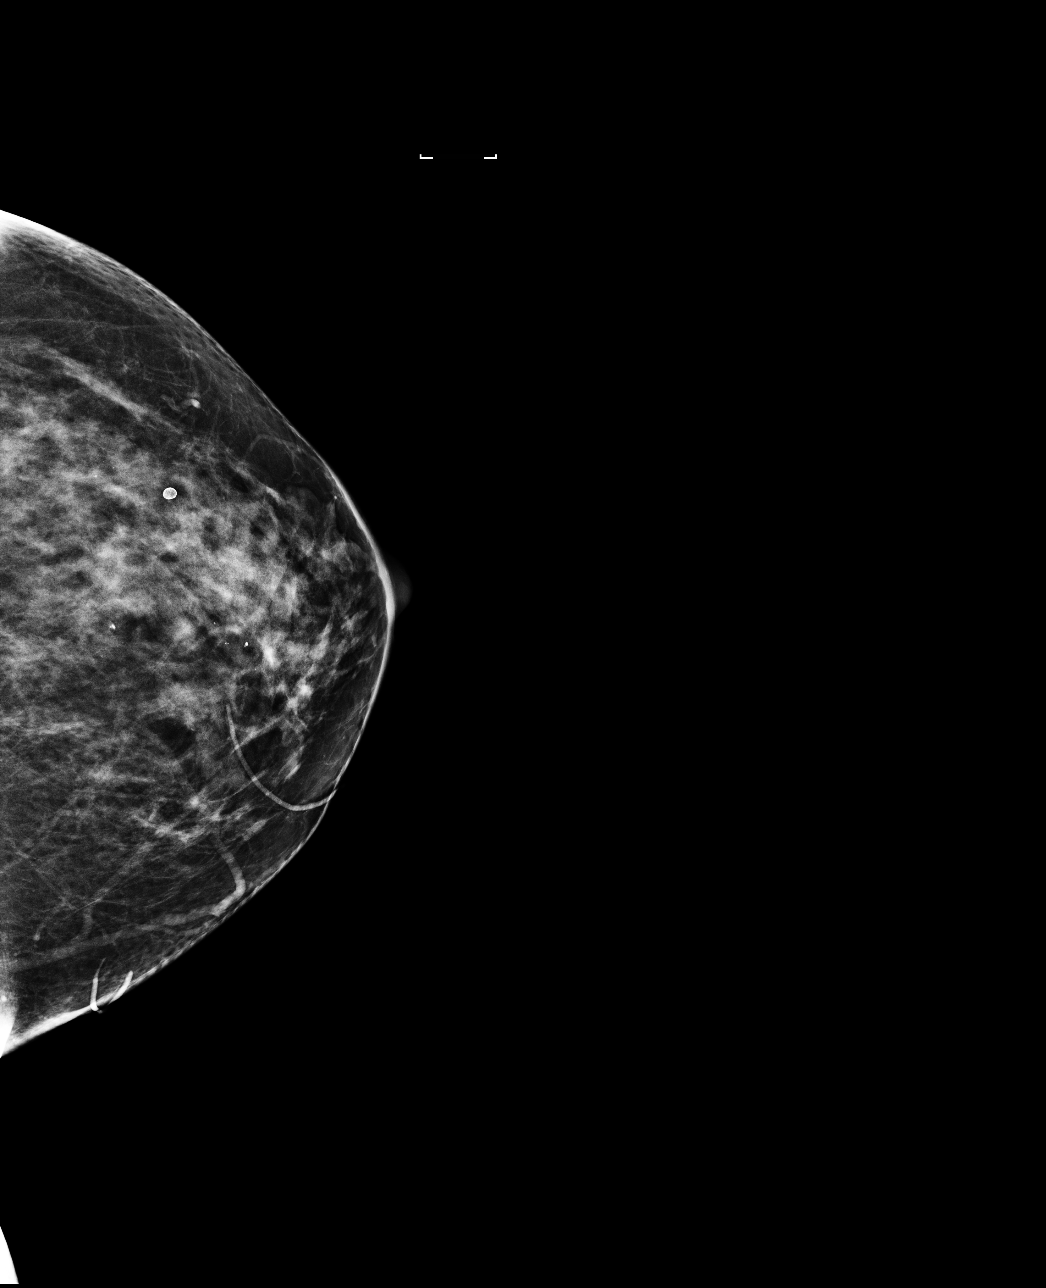

[4 of 4 positions shown; findings below may reference images not displayed]

ACR Breast Density Category c: The breast tissue is heterogeneously
dense, which may obscure small masses.
FINDINGS: There are no findings suspicious for malignancy. Images were
processed with CAD.
IMPRESSION: No mammographic evidence of malignancy. A result letter of this
screening mammogram will be mailed directly to the patient.

RECOMMENDATION:
Screening mammogram in one year. (Code:YJ-2-FEZ)

BI-RADS CATEGORY  1: Negative.

## 2018-03-04 DIAGNOSIS — Z96642 Presence of left artificial hip joint: Secondary | ICD-10-CM | POA: Diagnosis not present

## 2018-03-04 DIAGNOSIS — E785 Hyperlipidemia, unspecified: Secondary | ICD-10-CM | POA: Diagnosis not present

## 2018-03-04 DIAGNOSIS — Z880 Allergy status to penicillin: Secondary | ICD-10-CM | POA: Diagnosis not present

## 2018-03-04 DIAGNOSIS — Z471 Aftercare following joint replacement surgery: Secondary | ICD-10-CM | POA: Diagnosis not present

## 2018-03-04 DIAGNOSIS — Z6832 Body mass index (BMI) 32.0-32.9, adult: Secondary | ICD-10-CM | POA: Diagnosis not present

## 2018-03-04 DIAGNOSIS — Z87891 Personal history of nicotine dependence: Secondary | ICD-10-CM | POA: Diagnosis not present

## 2018-03-04 DIAGNOSIS — M1612 Unilateral primary osteoarthritis, left hip: Secondary | ICD-10-CM | POA: Diagnosis not present

## 2018-03-04 DIAGNOSIS — E890 Postprocedural hypothyroidism: Secondary | ICD-10-CM | POA: Diagnosis not present

## 2018-03-04 DIAGNOSIS — Z79899 Other long term (current) drug therapy: Secondary | ICD-10-CM | POA: Diagnosis not present

## 2018-03-31 DIAGNOSIS — M25552 Pain in left hip: Secondary | ICD-10-CM | POA: Diagnosis not present

## 2018-04-15 DIAGNOSIS — J301 Allergic rhinitis due to pollen: Secondary | ICD-10-CM | POA: Diagnosis not present

## 2018-05-06 ENCOUNTER — Other Ambulatory Visit: Payer: Self-pay | Admitting: Cardiology

## 2018-05-06 DIAGNOSIS — Q211 Atrial septal defect: Secondary | ICD-10-CM

## 2018-05-06 DIAGNOSIS — L820 Inflamed seborrheic keratosis: Secondary | ICD-10-CM | POA: Diagnosis not present

## 2018-05-06 DIAGNOSIS — Q2112 Patent foramen ovale: Secondary | ICD-10-CM

## 2018-05-31 DIAGNOSIS — R7303 Prediabetes: Secondary | ICD-10-CM | POA: Diagnosis not present

## 2018-05-31 DIAGNOSIS — E039 Hypothyroidism, unspecified: Secondary | ICD-10-CM | POA: Diagnosis not present

## 2018-05-31 DIAGNOSIS — M8580 Other specified disorders of bone density and structure, unspecified site: Secondary | ICD-10-CM | POA: Diagnosis not present

## 2018-05-31 DIAGNOSIS — E782 Mixed hyperlipidemia: Secondary | ICD-10-CM | POA: Diagnosis not present

## 2018-06-28 DIAGNOSIS — M79644 Pain in right finger(s): Secondary | ICD-10-CM | POA: Diagnosis not present

## 2018-06-28 DIAGNOSIS — M1811 Unilateral primary osteoarthritis of first carpometacarpal joint, right hand: Secondary | ICD-10-CM | POA: Diagnosis not present

## 2018-06-28 DIAGNOSIS — M25541 Pain in joints of right hand: Secondary | ICD-10-CM | POA: Diagnosis not present

## 2018-07-05 ENCOUNTER — Other Ambulatory Visit: Payer: BLUE CROSS/BLUE SHIELD

## 2018-08-02 ENCOUNTER — Other Ambulatory Visit: Payer: Self-pay

## 2018-08-02 ENCOUNTER — Ambulatory Visit (INDEPENDENT_AMBULATORY_CARE_PROVIDER_SITE_OTHER): Payer: BC Managed Care – PPO

## 2018-08-02 DIAGNOSIS — Q211 Atrial septal defect: Secondary | ICD-10-CM

## 2018-08-02 DIAGNOSIS — Q2112 Patent foramen ovale: Secondary | ICD-10-CM

## 2018-08-03 NOTE — Progress Notes (Signed)
Pt aware.

## 2018-08-16 DIAGNOSIS — M1811 Unilateral primary osteoarthritis of first carpometacarpal joint, right hand: Secondary | ICD-10-CM | POA: Diagnosis not present

## 2018-10-18 DIAGNOSIS — M1811 Unilateral primary osteoarthritis of first carpometacarpal joint, right hand: Secondary | ICD-10-CM | POA: Diagnosis not present

## 2018-10-18 DIAGNOSIS — M79644 Pain in right finger(s): Secondary | ICD-10-CM | POA: Diagnosis not present

## 2018-10-21 DIAGNOSIS — Z1283 Encounter for screening for malignant neoplasm of skin: Secondary | ICD-10-CM | POA: Diagnosis not present

## 2018-10-21 DIAGNOSIS — B078 Other viral warts: Secondary | ICD-10-CM | POA: Diagnosis not present

## 2018-10-21 DIAGNOSIS — D225 Melanocytic nevi of trunk: Secondary | ICD-10-CM | POA: Diagnosis not present

## 2018-11-03 DIAGNOSIS — M79672 Pain in left foot: Secondary | ICD-10-CM | POA: Diagnosis not present

## 2018-11-03 DIAGNOSIS — S93331D Other subluxation of right foot, subsequent encounter: Secondary | ICD-10-CM | POA: Diagnosis not present

## 2018-11-03 DIAGNOSIS — S93332D Other subluxation of left foot, subsequent encounter: Secondary | ICD-10-CM | POA: Diagnosis not present

## 2018-11-03 DIAGNOSIS — M79671 Pain in right foot: Secondary | ICD-10-CM | POA: Diagnosis not present

## 2018-12-02 DIAGNOSIS — E559 Vitamin D deficiency, unspecified: Secondary | ICD-10-CM | POA: Diagnosis not present

## 2018-12-02 DIAGNOSIS — E782 Mixed hyperlipidemia: Secondary | ICD-10-CM | POA: Diagnosis not present

## 2018-12-02 DIAGNOSIS — R7303 Prediabetes: Secondary | ICD-10-CM | POA: Diagnosis not present

## 2018-12-02 DIAGNOSIS — E039 Hypothyroidism, unspecified: Secondary | ICD-10-CM | POA: Diagnosis not present

## 2018-12-06 DIAGNOSIS — E039 Hypothyroidism, unspecified: Secondary | ICD-10-CM | POA: Diagnosis not present

## 2018-12-06 DIAGNOSIS — R7303 Prediabetes: Secondary | ICD-10-CM | POA: Diagnosis not present

## 2018-12-06 DIAGNOSIS — E782 Mixed hyperlipidemia: Secondary | ICD-10-CM | POA: Diagnosis not present

## 2018-12-06 DIAGNOSIS — Z0000 Encounter for general adult medical examination without abnormal findings: Secondary | ICD-10-CM | POA: Diagnosis not present

## 2018-12-06 DIAGNOSIS — J4530 Mild persistent asthma, uncomplicated: Secondary | ICD-10-CM | POA: Diagnosis not present

## 2019-01-19 DIAGNOSIS — Z79899 Other long term (current) drug therapy: Secondary | ICD-10-CM | POA: Diagnosis not present

## 2019-01-19 DIAGNOSIS — M1811 Unilateral primary osteoarthritis of first carpometacarpal joint, right hand: Secondary | ICD-10-CM | POA: Diagnosis not present

## 2019-01-19 DIAGNOSIS — Z791 Long term (current) use of non-steroidal anti-inflammatories (NSAID): Secondary | ICD-10-CM | POA: Diagnosis not present

## 2019-02-01 ENCOUNTER — Other Ambulatory Visit: Payer: Self-pay | Admitting: Family Medicine

## 2019-02-01 DIAGNOSIS — Z1231 Encounter for screening mammogram for malignant neoplasm of breast: Secondary | ICD-10-CM

## 2019-03-03 DIAGNOSIS — Z23 Encounter for immunization: Secondary | ICD-10-CM | POA: Diagnosis not present

## 2019-03-09 ENCOUNTER — Other Ambulatory Visit: Payer: Self-pay

## 2019-03-09 ENCOUNTER — Ambulatory Visit
Admission: RE | Admit: 2019-03-09 | Discharge: 2019-03-09 | Disposition: A | Discharge status: Home / Self Care | Payer: BC Managed Care – PPO | Source: Ambulatory Visit | Attending: Family Medicine | Admitting: Family Medicine

## 2019-03-09 DIAGNOSIS — Z1231 Encounter for screening mammogram for malignant neoplasm of breast: Secondary | ICD-10-CM | POA: Diagnosis not present

## 2019-03-22 DIAGNOSIS — M25552 Pain in left hip: Secondary | ICD-10-CM | POA: Diagnosis not present

## 2019-04-01 DIAGNOSIS — Z23 Encounter for immunization: Secondary | ICD-10-CM | POA: Diagnosis not present

## 2019-04-13 DIAGNOSIS — E039 Hypothyroidism, unspecified: Secondary | ICD-10-CM | POA: Diagnosis not present

## 2019-04-13 DIAGNOSIS — J4530 Mild persistent asthma, uncomplicated: Secondary | ICD-10-CM | POA: Diagnosis not present

## 2019-04-13 DIAGNOSIS — J3089 Other allergic rhinitis: Secondary | ICD-10-CM | POA: Diagnosis not present

## 2019-04-13 DIAGNOSIS — M8580 Other specified disorders of bone density and structure, unspecified site: Secondary | ICD-10-CM | POA: Diagnosis not present

## 2019-04-18 DIAGNOSIS — M1811 Unilateral primary osteoarthritis of first carpometacarpal joint, right hand: Secondary | ICD-10-CM | POA: Diagnosis not present

## 2019-05-16 DIAGNOSIS — N3946 Mixed incontinence: Secondary | ICD-10-CM | POA: Diagnosis not present

## 2019-05-16 DIAGNOSIS — R3915 Urgency of urination: Secondary | ICD-10-CM | POA: Diagnosis not present

## 2019-05-16 DIAGNOSIS — N281 Cyst of kidney, acquired: Secondary | ICD-10-CM | POA: Diagnosis not present

## 2019-05-16 DIAGNOSIS — R351 Nocturia: Secondary | ICD-10-CM | POA: Diagnosis not present

## 2019-06-07 DIAGNOSIS — R7309 Other abnormal glucose: Secondary | ICD-10-CM | POA: Diagnosis not present

## 2019-06-07 DIAGNOSIS — E039 Hypothyroidism, unspecified: Secondary | ICD-10-CM | POA: Diagnosis not present

## 2019-06-07 DIAGNOSIS — E782 Mixed hyperlipidemia: Secondary | ICD-10-CM | POA: Diagnosis not present

## 2019-07-18 DIAGNOSIS — M1811 Unilateral primary osteoarthritis of first carpometacarpal joint, right hand: Secondary | ICD-10-CM | POA: Diagnosis not present

## 2019-07-29 DIAGNOSIS — M19041 Primary osteoarthritis, right hand: Secondary | ICD-10-CM | POA: Diagnosis not present

## 2019-07-29 DIAGNOSIS — Z23 Encounter for immunization: Secondary | ICD-10-CM | POA: Diagnosis not present

## 2019-07-29 DIAGNOSIS — S60450A Superficial foreign body of right index finger, initial encounter: Secondary | ICD-10-CM | POA: Diagnosis not present

## 2019-07-29 DIAGNOSIS — M795 Residual foreign body in soft tissue: Secondary | ICD-10-CM | POA: Diagnosis not present

## 2019-07-29 DIAGNOSIS — M79644 Pain in right finger(s): Secondary | ICD-10-CM | POA: Diagnosis not present

## 2019-08-01 DIAGNOSIS — M795 Residual foreign body in soft tissue: Secondary | ICD-10-CM | POA: Diagnosis not present

## 2019-09-08 DIAGNOSIS — E039 Hypothyroidism, unspecified: Secondary | ICD-10-CM | POA: Diagnosis not present

## 2019-09-08 DIAGNOSIS — E782 Mixed hyperlipidemia: Secondary | ICD-10-CM | POA: Diagnosis not present

## 2019-09-16 ENCOUNTER — Other Ambulatory Visit: Payer: Self-pay | Admitting: Family Medicine

## 2019-09-16 DIAGNOSIS — E782 Mixed hyperlipidemia: Secondary | ICD-10-CM

## 2019-09-29 ENCOUNTER — Ambulatory Visit
Admission: RE | Admit: 2019-09-29 | Discharge: 2019-09-29 | Disposition: A | Discharge status: Home / Self Care | Payer: PRIVATE HEALTH INSURANCE | Source: Ambulatory Visit | Attending: Family Medicine | Admitting: Family Medicine

## 2019-09-29 DIAGNOSIS — E782 Mixed hyperlipidemia: Secondary | ICD-10-CM

## 2019-10-17 ENCOUNTER — Encounter: Payer: Self-pay | Admitting: Cardiology

## 2019-10-17 ENCOUNTER — Ambulatory Visit: Payer: BC Managed Care – PPO | Admitting: Cardiology

## 2019-10-17 ENCOUNTER — Other Ambulatory Visit: Payer: Self-pay

## 2019-10-17 VITALS — BP 120/50 | HR 84 | Resp 16 | Ht 67.0 in | Wt 200.0 lb

## 2019-10-17 DIAGNOSIS — R931 Abnormal findings on diagnostic imaging of heart and coronary circulation: Secondary | ICD-10-CM | POA: Diagnosis not present

## 2019-10-17 MED ORDER — ATORVASTATIN CALCIUM 20 MG PO TABS: 20.0000 mg | ORAL_TABLET | Freq: Every day | ORAL | 3 refills | Status: DC

## 2019-10-17 MED ORDER — ASPIRIN EC 81 MG PO TBEC: 81.0000 mg | DELAYED_RELEASE_TABLET | Freq: Every day | ORAL | 3 refills | Status: AC

## 2019-10-17 NOTE — Progress Notes (Signed)
Follow up visit  Subjective:   Rebekah Curry, female    DOB: 1950/12/15, 69 y.o.   MRN: [id number]     HPI  Chief Complaint  Patient presents with  . Hyperlipidemia  . Follow-up  . Results    CT    69 y.o. Caucasian female with hypothyroidism, hyperlipidemia, referred back for management of elevated calcium score.  Patient was last seen by me in 2019.  She had mild MR, mild TR, and possible PFO on echocardiogram.  She recently underwent calcium score test through her PCP, that showed total calcium score 438.  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  She denies any chest pain, shortness of breath symptoms.  Also denies any presyncope or syncope.  Patient is currently not on statin.  She has previously tried Crestor that caused severe myalgias.  Patient is a non-smoker.  She does not have any significant family history of early CAD.   Current Outpatient Medications on File Prior to Visit  Medication Sig Dispense Refill  . cetirizine (ZYRTEC) 10 MG tablet Take by mouth as needed.    . Cholecalciferol (VITAMIN D) 2000 UNITS CAPS Take 1 capsule by mouth daily.    . fluticasone (FLONASE) 50 MCG/ACT nasal spray as needed.    Marland Kitchen levothyroxine (SYNTHROID, LEVOTHROID) 137 MCG tablet TAKE 2 TABLETS DAILY    . Multiple Vitamin (MULTIVITAMIN) capsule Take 1 capsule by mouth daily.    . SYMBICORT 80-4.5 MCG/ACT inhaler Inhale 2 puffs into the lungs 2 (two) times daily.     No current facility-administered medications on file prior to visit.    Cardiovascular & other pertient studies:  EKG 10/17/2019: Sinus rhythm 76 bpm Normal EKG   CT Cardiac scoring 09/29/2019: 1. Total Agatston score of 438, corresponding to 90th percentile for age, sex, and race based cohort. 2. Aortic Atherosclerosis   LM: 168 LAD: 131 Lcx: 0 RCA: 0  Echocardiogram 08/02/2018:  Left ventricle cavity is normal in size. Mild concentric hypertrophy of  the left ventricle.  Normal global wall motion. Normal LV systolic function  with visual EF 50-55%. Doppler evidence of grade I (impaired) diastolic  dysfunction, normal LAP.  Left atrial cavity is mildly dilated. Thin interatrial septum with  possible PFO present.  Mild (Grade I) mitral regurgitation.  Mild tricuspid regurgitation.  No evidence of pulmonary hypertension.  No significant change compared to previous study on 06/25/2017.   Stress test 2009: 1. Normal myocardial perfusion study. No findings for ischemia or  infarction.  2. Normal left ventricular wall motion and myocardial thickening  with contraction.  3. Estimated ejection fraction 71%.   Recent labs: 09/08/2019: Glucose 123, BUN/Cr 8/0.59. EGFR 84 HbA1C 5.9% Chol 212, TG 122, HDL 44, LDL 146 TSH 0.9 normal    Review of Systems  Cardiovascular: Negative for chest pain, dyspnea on exertion, leg swelling, palpitations and syncope.         Vitals:   10/17/19 1124  BP: (!) 120/50  Pulse: 84  Resp: 16  SpO2: 97%     Body mass index is 31.32 kg/m. Filed Weights   10/17/19 1124  Weight: 200 lb (90.7 kg)     Objective:   Physical Exam Vitals and nursing note reviewed.  Constitutional:      General: She is not in acute distress. Neck:     Vascular: No JVD.  Cardiovascular:     Rate and Rhythm: Normal rate and regular rhythm.  Heart sounds: Normal heart sounds. No murmur heard.   Pulmonary:     Effort: Pulmonary effort is normal.     Breath sounds: Normal breath sounds. No wheezing or rales.           Assessment & Recommendations:   69 y.o. Caucasian female with hypothyroidism, hyperlipidemia, elevated calcium score.  Elevated calcium score at 438, including score of 168 in left main.  She is asymptomatic from CAD standpoint.  Recommend aspirin 81 mg and high intensity statin.  She is had myalgias with Crestor, will start Lipitor 20 mg daily.  If tolerated well, will increase to 40 mg. We will  obtain exercise nuclear stress test for stratification purposes. We will also obtain echocardiogram to ensure normal ejection fraction, given possibility of left main disease. Discussed heart healthy diet and physical exercise.   Nigel Mormon, MD Pager: [phone] Office: [phone]

## 2019-10-21 ENCOUNTER — Other Ambulatory Visit: Payer: Self-pay

## 2019-10-21 ENCOUNTER — Other Ambulatory Visit (HOSPITAL_COMMUNITY)
Admission: RE | Admit: 2019-10-21 | Discharge: 2019-10-21 | Disposition: A | Discharge status: Home / Self Care | Payer: BC Managed Care – PPO | Source: Ambulatory Visit | Attending: Cardiology | Admitting: Cardiology

## 2019-10-21 DIAGNOSIS — Z20822 Contact with and (suspected) exposure to covid-19: Secondary | ICD-10-CM | POA: Insufficient documentation

## 2019-10-21 DIAGNOSIS — Z01812 Encounter for preprocedural laboratory examination: Secondary | ICD-10-CM | POA: Diagnosis not present

## 2019-10-21 LAB — SARS CORONAVIRUS 2 (TAT 6-24 HRS): SARS Coronavirus 2: NEGATIVE

## 2019-10-26 ENCOUNTER — Ambulatory Visit: Payer: BC Managed Care – PPO

## 2019-10-26 ENCOUNTER — Other Ambulatory Visit: Payer: Self-pay

## 2019-10-26 ENCOUNTER — Other Ambulatory Visit: Payer: BC Managed Care – PPO

## 2019-10-26 DIAGNOSIS — R931 Abnormal findings on diagnostic imaging of heart and coronary circulation: Secondary | ICD-10-CM | POA: Diagnosis not present

## 2019-10-26 DIAGNOSIS — Z0189 Encounter for other specified special examinations: Secondary | ICD-10-CM | POA: Diagnosis not present

## 2019-11-16 ENCOUNTER — Encounter: Payer: Self-pay | Admitting: Cardiology

## 2019-11-16 ENCOUNTER — Ambulatory Visit: Payer: BC Managed Care – PPO | Admitting: Cardiology

## 2019-11-16 ENCOUNTER — Other Ambulatory Visit: Payer: Self-pay

## 2019-11-16 VITALS — BP 136/71 | HR 88 | Resp 16 | Ht 67.0 in | Wt 202.0 lb

## 2019-11-16 DIAGNOSIS — E782 Mixed hyperlipidemia: Secondary | ICD-10-CM

## 2019-11-16 DIAGNOSIS — I10 Essential (primary) hypertension: Secondary | ICD-10-CM | POA: Diagnosis not present

## 2019-11-16 DIAGNOSIS — R931 Abnormal findings on diagnostic imaging of heart and coronary circulation: Secondary | ICD-10-CM | POA: Diagnosis not present

## 2019-11-16 MED ORDER — PRAVASTATIN SODIUM 40 MG PO TABS: 20.0000 mg | ORAL_TABLET | Freq: Every day | ORAL | 3 refills | Status: DC

## 2019-11-16 MED ORDER — EZETIMIBE 10 MG PO TABS: 10.0000 mg | ORAL_TABLET | Freq: Every day | ORAL | 3 refills | Status: DC

## 2019-11-16 NOTE — Progress Notes (Signed)
Follow up visit  Subjective:   Rebekah Curry, female    DOB: 21-Sep-1950, 69 y.o.   MRN: [id number]    HPI  Chief Complaint  Patient presents with  . Elevated coronary artery calcium scoElevated coronary artery  . Follow-up    4 week   . Results    Echo     69 y.o. Caucasian female with hypothyroidism, hyperlipidemia, referred back for management of elevated calcium score.  Patient underwent echocardiogram and stress test, results below.  Patient has not had any episodes of chest pain or shortness of breath.  However, she has had severe myalgias with Lipitor.  She had similar symptoms with Crestor in the past.   Current Outpatient Medications on File Prior to Visit  Medication Sig Dispense Refill  . aspirin EC 81 MG tablet Take 1 tablet (81 mg total) by mouth daily. Swallow whole. 90 tablet 3  . atorvastatin (LIPITOR) 20 MG tablet Take 1 tablet (20 mg total) by mouth daily. 30 tablet 3  . cetirizine (ZYRTEC) 10 MG tablet Take by mouth as needed.    . Cholecalciferol (VITAMIN D) 2000 UNITS CAPS Take 1 capsule by mouth daily.    . fluticasone (FLONASE) 50 MCG/ACT nasal spray as needed.    Marland Kitchen levothyroxine (SYNTHROID, LEVOTHROID) 137 MCG tablet TAKE 2 TABLETS DAILY    . Multiple Vitamin (MULTIVITAMIN) capsule Take 1 capsule by mouth daily.    . SYMBICORT 80-4.5 MCG/ACT inhaler Inhale 2 puffs into the lungs 2 (two) times daily.     No current facility-administered medications on file prior to visit.    Cardiovascular & other pertient studies:  Echocardiogram 10/26/2019:  1. Normal LV systolic function with visual EF 60-65%. Left ventricle  cavity is normal in size. Mild left ventricular hypertrophy. Normal global  wall motion. Normal diastolic  filling pattern, normal LAP. Calculated EF 58%.  2. Structurally normal mitral valve. No evidence of mitral stenosis. Mild  (Grade I) mitral regurgitation.  3. Compared to prior study dated 08/02/2018 no significant  changes.  Exercise Sestamibi Stress Test 10/26/2019: Normal ECG stress. The patient exercised for 8 minutes and 0 seconds of a Bruce protocol, achieving approximately 10.13 METs and 89% MPHR. The baseline blood pressure was 130/76 mmHg and increased to 220/70 mmHg, which is a hypertensive response to exercise. Frequent PVC in bigeminal pattern in recovery and rare PVC during exercise stress.    Myocardial perfusion is normal. Overall LV systolic function is normal without regional wall motion abnormalities. Stress LV EF: 68%.  No previous exam available for comparison. Low risk.   EKG 10/17/2019: Sinus rhythm 76 bpm Normal EKG   CT Cardiac scoring 09/29/2019: 1. Total Agatston score of 438, corresponding to 90th percentile for age, sex, and race based cohort. 2. Aortic Atherosclerosis   LM: 168 LAD: 131 Lcx: 0 RCA: 0  Recent labs: 09/08/2019: Glucose 123, BUN/Cr 8/0.59. EGFR 84 HbA1C 5.9% Chol 212, TG 122, HDL 44, LDL 146 TSH 0.9 normal    Review of Systems  Cardiovascular: Negative for chest pain, dyspnea on exertion, leg swelling, palpitations and syncope.         Vitals:   11/16/19 0905  BP: 136/71  Pulse: 88  Resp: 16  SpO2: 94%     Body mass index is 31.64 kg/m. Filed Weights   11/16/19 0905  Weight: 202 lb (91.6 kg)     Objective:   Physical Exam Vitals and nursing note reviewed.  Constitutional:  General: She is not in acute distress. Neck:     Vascular: No JVD.  Cardiovascular:     Rate and Rhythm: Normal rate and regular rhythm.     Heart sounds: Normal heart sounds. No murmur heard.   Pulmonary:     Effort: Pulmonary effort is normal.     Breath sounds: Normal breath sounds. No wheezing or rales.           Assessment & Recommendations:   69 y.o. Caucasian female with hypothyroidism, hyperlipidemia, elevated calcium score.  Elevated calcium score: 438, including score of 168 in left main.  No ischemia on stress testing.   Continue aggressive risk factor modification. Continue aspirin 81 mg daily. See discussion regarding lipid management, as below.  Hyperlipidemia, statin intolerance: Severe myalgias with Crestor and Lipitor. Will try pravastatin 40 mg and Zetia 10 mg daily. Repeat lipid panel in 3 months.  F/u in 3 months.     Nigel Mormon, MD Pager: [phone] Office: [phone]

## 2019-12-08 DIAGNOSIS — E039 Hypothyroidism, unspecified: Secondary | ICD-10-CM | POA: Diagnosis not present

## 2019-12-08 DIAGNOSIS — R7309 Other abnormal glucose: Secondary | ICD-10-CM | POA: Diagnosis not present

## 2019-12-13 DIAGNOSIS — Z0000 Encounter for general adult medical examination without abnormal findings: Secondary | ICD-10-CM | POA: Diagnosis not present

## 2019-12-13 DIAGNOSIS — Z23 Encounter for immunization: Secondary | ICD-10-CM | POA: Diagnosis not present

## 2019-12-13 DIAGNOSIS — E039 Hypothyroidism, unspecified: Secondary | ICD-10-CM | POA: Diagnosis not present

## 2019-12-13 DIAGNOSIS — J45909 Unspecified asthma, uncomplicated: Secondary | ICD-10-CM | POA: Diagnosis not present

## 2019-12-13 DIAGNOSIS — E782 Mixed hyperlipidemia: Secondary | ICD-10-CM | POA: Diagnosis not present

## 2019-12-13 DIAGNOSIS — R7309 Other abnormal glucose: Secondary | ICD-10-CM | POA: Diagnosis not present

## 2020-01-25 ENCOUNTER — Other Ambulatory Visit: Payer: Self-pay | Admitting: Family Medicine

## 2020-01-25 DIAGNOSIS — Z1231 Encounter for screening mammogram for malignant neoplasm of breast: Secondary | ICD-10-CM

## 2020-01-26 DIAGNOSIS — D225 Melanocytic nevi of trunk: Secondary | ICD-10-CM | POA: Diagnosis not present

## 2020-01-26 DIAGNOSIS — L821 Other seborrheic keratosis: Secondary | ICD-10-CM | POA: Diagnosis not present

## 2020-01-26 DIAGNOSIS — D1801 Hemangioma of skin and subcutaneous tissue: Secondary | ICD-10-CM | POA: Diagnosis not present

## 2020-02-08 DIAGNOSIS — E782 Mixed hyperlipidemia: Secondary | ICD-10-CM | POA: Diagnosis not present

## 2020-02-08 DIAGNOSIS — E039 Hypothyroidism, unspecified: Secondary | ICD-10-CM | POA: Diagnosis not present

## 2020-02-16 ENCOUNTER — Other Ambulatory Visit: Payer: Self-pay

## 2020-02-16 ENCOUNTER — Ambulatory Visit: Payer: BC Managed Care – PPO | Admitting: Cardiology

## 2020-02-16 ENCOUNTER — Encounter: Payer: Self-pay | Admitting: Cardiology

## 2020-02-16 VITALS — BP 123/59 | HR 81 | Temp 97.7°F | Resp 16 | Ht 67.0 in | Wt 202.2 lb

## 2020-02-16 DIAGNOSIS — I10 Essential (primary) hypertension: Secondary | ICD-10-CM

## 2020-02-16 DIAGNOSIS — E782 Mixed hyperlipidemia: Secondary | ICD-10-CM | POA: Diagnosis not present

## 2020-02-16 DIAGNOSIS — R931 Abnormal findings on diagnostic imaging of heart and coronary circulation: Secondary | ICD-10-CM | POA: Diagnosis not present

## 2020-02-16 MED ORDER — PRAVASTATIN SODIUM 80 MG PO TABS: 80.0000 mg | ORAL_TABLET | Freq: Every day | ORAL | 1 refills | Status: DC

## 2020-02-16 NOTE — Progress Notes (Signed)
Follow up visit  Subjective:   Rebekah Curry, female    DOB: 1950/09/16, 70 y.o.   MRN: [id number]    HPI  Chief Complaint  Patient presents with  . Follow-up    3 months    70 y.o. Caucasian female with hypothyroidism, hyperlipidemia, referred back for management of elevated calcium score.  Patient is doing well. Doe snot have any complaints other than difficulty sleeping at night. She is taking pravastatin 1/2 of 40 mg. Reviewed lipid panel results with the patient, details below.   Current Outpatient Medications on File Prior to Visit  Medication Sig Dispense Refill  . aspirin EC 81 MG tablet Take 1 tablet (81 mg total) by mouth daily. Swallow whole. 90 tablet 3  . cetirizine (ZYRTEC) 10 MG tablet Take by mouth as needed.    . Cholecalciferol (VITAMIN D) 2000 UNITS CAPS Take 1 capsule by mouth daily.    Marland Kitchen ezetimibe (ZETIA) 10 MG tablet Take 1 tablet (10 mg total) by mouth daily. 30 tablet 3  . fluticasone (FLONASE) 50 MCG/ACT nasal spray as needed.    Marland Kitchen levothyroxine (SYNTHROID, LEVOTHROID) 137 MCG tablet TAKE 2 TABLETS DAILY    . Multiple Vitamin (MULTIVITAMIN) capsule Take 1 capsule by mouth daily.    . pravastatin (PRAVACHOL) 40 MG tablet Take 0.5 tablets (20 mg total) by mouth daily. 30 tablet 3  . SYMBICORT 80-4.5 MCG/ACT inhaler Inhale 2 puffs into the lungs 2 (two) times daily.     No current facility-administered medications on file prior to visit.    Cardiovascular & other pertient studies:  Echocardiogram 10/26/2019:  1. Normal LV systolic function with visual EF 60-65%. Left ventricle  cavity is normal in size. Mild left ventricular hypertrophy. Normal global  wall motion. Normal diastolic  filling pattern, normal LAP. Calculated EF 58%.  2. Structurally normal mitral valve. No evidence of mitral stenosis. Mild  (Grade I) mitral regurgitation.  3. Compared to prior study dated 08/02/2018 no significant changes.  Exercise Sestamibi Stress Test  10/26/2019: Normal ECG stress. The patient exercised for 8 minutes and 0 seconds of a Bruce protocol, achieving approximately 10.13 METs and 89% MPHR. The baseline blood pressure was 130/76 mmHg and increased to 220/70 mmHg, which is a hypertensive response to exercise. Frequent PVC in bigeminal pattern in recovery and rare PVC during exercise stress.    Myocardial perfusion is normal. Overall LV systolic function is normal without regional wall motion abnormalities. Stress LV EF: 68%.  No previous exam available for comparison. Low risk.   EKG 10/17/2019: Sinus rhythm 76 bpm Normal EKG   CT Cardiac scoring 09/29/2019: 1. Total Agatston score of 438, corresponding to 90th percentile for age, sex, and race based cohort. 2. Aortic Atherosclerosis   LM: 168 LAD: 131 Lcx: 0 RCA: 0  Recent labs: 02/08/2020: Chol 192, TG 107, HDL 52, LDL 121  09/08/2019: Glucose 123, BUN/Cr 8/0.59. EGFR 84 HbA1C 5.9% Chol 212, TG 122, HDL 44, LDL 146 TSH 0.9 normal    Review of Systems  Cardiovascular: Negative for chest pain, dyspnea on exertion, leg swelling, palpitations and syncope.        Vitals:   02/16/20 0927 02/16/20 0932  BP: (!) 147/65 (!) 123/59  Pulse: 83 81  Resp: 16   Temp: 97.7 F (36.5 C)   SpO2: 95% 95%    Body mass index is 31.67 kg/m. Filed Weights   02/16/20 0927  Weight: 202 lb 3.2 oz (91.7 kg)  Objective:   Physical Exam Vitals and nursing note reviewed.  Constitutional:      General: She is not in acute distress. Neck:     Vascular: No JVD.  Cardiovascular:     Rate and Rhythm: Normal rate and regular rhythm.     Heart sounds: Normal heart sounds. No murmur heard.   Pulmonary:     Effort: Pulmonary effort is normal.     Breath sounds: Normal breath sounds. No wheezing or rales.         Assessment & Recommendations:   70 y.o. Caucasian female with hypothyroidism, hyperlipidemia, elevated calcium score.  Elevated calcium score: 438,  including score of 168 in left main.  No ischemia on stress testing.  Continue aggressive risk factor modification. Continue aspirin 81 mg daily. See discussion regarding lipid management, as below.  Hyperlipidemia, statin intolerance: Severe myalgias with Crestor and Lipitor. Currently on pravastatin 20 mg and Zetia 10 mg daily. LDL down from 146 to 121. She is reluctant to start PCSK9 inhibitor at this time. Will increase pravastatin to 80 mg. Repeat lipid panel in 3 months  F/u in 3 months.     Nigel Mormon, MD Pager: [phone] Office: [phone]

## 2020-03-09 ENCOUNTER — Ambulatory Visit
Admission: RE | Admit: 2020-03-09 | Discharge: 2020-03-09 | Disposition: A | Discharge status: Home / Self Care | Payer: BC Managed Care – PPO | Source: Ambulatory Visit | Attending: Family Medicine | Admitting: Family Medicine

## 2020-03-09 ENCOUNTER — Ambulatory Visit: Payer: PRIVATE HEALTH INSURANCE

## 2020-03-09 ENCOUNTER — Other Ambulatory Visit: Payer: Self-pay

## 2020-03-09 DIAGNOSIS — Z1231 Encounter for screening mammogram for malignant neoplasm of breast: Secondary | ICD-10-CM | POA: Diagnosis not present

## 2020-03-12 ENCOUNTER — Other Ambulatory Visit: Payer: Self-pay

## 2020-03-12 DIAGNOSIS — R931 Abnormal findings on diagnostic imaging of heart and coronary circulation: Secondary | ICD-10-CM

## 2020-03-12 DIAGNOSIS — E782 Mixed hyperlipidemia: Secondary | ICD-10-CM

## 2020-03-12 MED ORDER — EZETIMIBE 10 MG PO TABS: 10.0000 mg | ORAL_TABLET | Freq: Every day | ORAL | 3 refills | Status: DC

## 2020-05-30 ENCOUNTER — Ambulatory Visit: Payer: PRIVATE HEALTH INSURANCE | Admitting: Cardiology

## 2020-05-30 ENCOUNTER — Other Ambulatory Visit: Payer: Self-pay

## 2020-05-30 ENCOUNTER — Encounter: Payer: Self-pay | Admitting: Cardiology

## 2020-05-30 VITALS — BP 132/64 | HR 78 | Temp 98.4°F | Resp 17 | Ht 67.0 in | Wt 204.0 lb

## 2020-05-30 DIAGNOSIS — E782 Mixed hyperlipidemia: Secondary | ICD-10-CM

## 2020-05-30 DIAGNOSIS — R931 Abnormal findings on diagnostic imaging of heart and coronary circulation: Secondary | ICD-10-CM

## 2020-05-30 NOTE — Progress Notes (Signed)
Follow up visit  Subjective:   LINEA CALLES, female    DOB: 04-27-1950, 70 y.o.   MRN: [id number]    HPI   Chief Complaint  Patient presents with  . Hypertension  . Results    Elevated coronary artery calcium score  . Follow-up    3 month    70 y.o. Caucasian female with hypothyroidism, hyperlipidemia, referred back for management of elevated calcium score.  Patient is doing well. She denies chest pain, shortness of breath, palpitations, leg edema, orthopnea, PND, TIA/syncope.  Reviewed recent lipid panel results with the patient, details below.  Current Outpatient Medications on File Prior to Visit  Medication Sig Dispense Refill  . aspirin EC 81 MG tablet Take 1 tablet (81 mg total) by mouth daily. Swallow whole. 90 tablet 3  . cetirizine (ZYRTEC) 10 MG tablet Take by mouth as needed.    . Cholecalciferol (VITAMIN D) 2000 UNITS CAPS Take 1 capsule by mouth daily.    Marland Kitchen ezetimibe (ZETIA) 10 MG tablet Take 1 tablet (10 mg total) by mouth daily. 90 tablet 3  . fluticasone (FLONASE) 50 MCG/ACT nasal spray as needed.    Marland Kitchen levothyroxine (SYNTHROID, LEVOTHROID) 137 MCG tablet TAKE 2 TABLETS DAILY    . Multiple Vitamin (MULTIVITAMIN) capsule Take 1 capsule by mouth daily.    . pravastatin (PRAVACHOL) 80 MG tablet Take 1 tablet (80 mg total) by mouth daily. 90 tablet 1  . SYMBICORT 80-4.5 MCG/ACT inhaler Inhale 2 puffs into the lungs 2 (two) times daily.     No current facility-administered medications on file prior to visit.    Cardiovascular & other pertient studies:  Echocardiogram 10/26/2019:  1. Normal LV systolic function with visual EF 60-65%. Left ventricle  cavity is normal in size. Mild left ventricular hypertrophy. Normal global  wall motion. Normal diastolic  filling pattern, normal LAP. Calculated EF 58%.  2. Structurally normal mitral valve. No evidence of mitral stenosis. Mild  (Grade I) mitral regurgitation.  3. Compared to prior study dated 08/02/2018 no  significant changes.  Exercise Sestamibi Stress Test 10/26/2019: Normal ECG stress. The patient exercised for 8 minutes and 0 seconds of a Bruce protocol, achieving approximately 10.13 METs and 89% MPHR. The baseline blood pressure was 130/76 mmHg and increased to 220/70 mmHg, which is a hypertensive response to exercise. Frequent PVC in bigeminal pattern in recovery and rare PVC during exercise stress.    Myocardial perfusion is normal. Overall LV systolic function is normal without regional wall motion abnormalities. Stress LV EF: 68%.  No previous exam available for comparison. Low risk.   EKG 10/17/2019: Sinus rhythm 76 bpm Normal EKG   CT Cardiac scoring 09/29/2019: 1. Total Agatston score of 438, corresponding to 90th percentile for age, sex, and race based cohort. 2. Aortic Atherosclerosis   LM: 168 LAD: 131 Lcx: 0 RCA: 0  Recent labs: 05/17/2020: Chol 175, TG 80, HDL 54, LDL 106  02/08/2020: Chol 192, TG 107, HDL 52, LDL 121  09/08/2019: Glucose 123, BUN/Cr 8/0.59. EGFR 84 HbA1C 5.9% Chol 212, TG 122, HDL 44, LDL 146 TSH 0.9 normal    Review of Systems  Cardiovascular: Negative for chest pain, dyspnea on exertion, leg swelling, palpitations and syncope.        Vitals:   05/30/20 0857  BP: 132/64  Pulse: 78  Resp: 17  Temp: 98.4 F (36.9 C)  SpO2: 98%     Body mass index is 31.95 kg/m. Filed Weights   05/30/20 0857  Weight: 204 lb (92.5 kg)     Objective:   Physical Exam Vitals and nursing note reviewed.  Constitutional:      General: She is not in acute distress. Neck:     Vascular: No JVD.  Cardiovascular:     Rate and Rhythm: Normal rate and regular rhythm.     Heart sounds: Normal heart sounds. No murmur heard.   Pulmonary:     Effort: Pulmonary effort is normal.     Breath sounds: Normal breath sounds. No wheezing or rales.         Assessment & Recommendations:   70 y.o. Caucasian female with hypothyroidism, hyperlipidemia,  elevated calcium score.  Elevated calcium score: 438, including score of 168 in left main.  No ischemia on stress testing.  Continue aggressive risk factor modification. In absence of known bleeding issues, normal colonoscopy in 2020, benefits outweigh risks.  Continue aspirin 81 mg daily. See discussion regarding lipid management, as below.   Hyperlipidemia, statin intolerance: Severe myalgias with Crestor and Lipitor, as well as highest dose of pravastatin 80 mg.. Currently on pravastatin 40 mg and Zetia 10 mg daily. LDL down from 146 to 106. HDL 54. She is reluctant to start PCSK9 inhibitor at this time. Reasonable to continue pravastatin 40 mg and Zetia 10 mg daily.  F/u in 1 year   Nigel Mormon, MD Pager: [phone] Office: [phone]

## 2020-12-24 ENCOUNTER — Other Ambulatory Visit: Payer: Self-pay | Admitting: Family Medicine

## 2020-12-24 DIAGNOSIS — M8580 Other specified disorders of bone density and structure, unspecified site: Secondary | ICD-10-CM

## 2020-12-31 ENCOUNTER — Other Ambulatory Visit: Payer: Self-pay | Admitting: Family Medicine

## 2020-12-31 DIAGNOSIS — Z1231 Encounter for screening mammogram for malignant neoplasm of breast: Secondary | ICD-10-CM

## 2021-03-11 ENCOUNTER — Ambulatory Visit
Admission: RE | Admit: 2021-03-11 | Discharge: 2021-03-11 | Disposition: A | Discharge status: Home / Self Care | Payer: Medicare HMO | Source: Ambulatory Visit | Attending: Family Medicine | Admitting: Family Medicine

## 2021-03-11 DIAGNOSIS — Z1231 Encounter for screening mammogram for malignant neoplasm of breast: Secondary | ICD-10-CM

## 2021-05-21 ENCOUNTER — Ambulatory Visit: Payer: Medicare HMO | Admitting: Cardiology

## 2021-05-21 ENCOUNTER — Encounter: Payer: Self-pay | Admitting: Cardiology

## 2021-05-21 VITALS — BP 125/66 | HR 68 | Temp 98.0°F | Resp 16 | Ht 67.0 in | Wt 187.0 lb

## 2021-05-21 DIAGNOSIS — I10 Essential (primary) hypertension: Secondary | ICD-10-CM

## 2021-05-21 DIAGNOSIS — E782 Mixed hyperlipidemia: Secondary | ICD-10-CM

## 2021-05-21 DIAGNOSIS — T466X5A Adverse effect of antihyperlipidemic and antiarteriosclerotic drugs, initial encounter: Secondary | ICD-10-CM | POA: Insufficient documentation

## 2021-05-21 MED ORDER — NEXLIZET 180-10 MG PO TABS: 1.0000 | ORAL_TABLET | Freq: Every day | ORAL | 5 refills | Status: DC

## 2021-05-21 NOTE — Progress Notes (Signed)
? ?Follow up visit ? ?Subjective:  ? ?Rebekah Curry, female    DOB: [birth year], 71 y.o.   MRN: [id number] ? ? ? ?HPI ? ? ?Chief Complaint  ?Patient presents with  ? Hyperlipidemia  ? ? ?71 y.o. Caucasian female with hypothyroidism, hyperlipidemia, referred back for management of elevated calcium score. ? ?Patient is doing well. She denies chest pain, shortness of breath, palpitations, leg edema, orthopnea, PND, TIA/syncope.  She has lost 23 lbs with regular exercise and diet changes. Like other statins including Crestor and lipitor, patient has had myalgias in a daily basis with pravastatin. Therefore, she would like to come off statin. Reviewed recent test results with the patient, details below.  ? ? ?Current Outpatient Medications:  ?  aspirin EC 81 MG tablet, Take 1 tablet (81 mg total) by mouth daily. Swallow whole., Disp: 90 tablet, Rfl: 3 ?  cetirizine (ZYRTEC) 10 MG tablet, Take by mouth as needed., Disp: , Rfl:  ?  Cholecalciferol (VITAMIN D) 2000 UNITS CAPS, Take 1 capsule by mouth daily., Disp: , Rfl:  ?  Coenzyme Q10-Fish Oil-Vit E (CO-Q 10 OMEGA-3 FISH OIL) CAPS, See admin instructions., Disp: , Rfl:  ?  ezetimibe (ZETIA) 10 MG tablet, Take 1 tablet (10 mg total) by mouth daily., Disp: 90 tablet, Rfl: 3 ?  fluticasone (FLONASE) 50 MCG/ACT nasal spray, as needed., Disp: , Rfl:  ?  levothyroxine (SYNTHROID, LEVOTHROID) 137 MCG tablet, TAKE 2 TABLETS DAILY, Disp: , Rfl:  ?  Multiple Vitamin (MULTIVITAMIN) capsule, Take 1 capsule by mouth daily., Disp: , Rfl:  ?  pravastatin (PRAVACHOL) 80 MG tablet, Take 1 tablet (80 mg total) by mouth daily., Disp: 90 tablet, Rfl: 1 ? ?Cardiovascular & other pertient studies: ? ?EKG 05/21/2021: ?Sinus rhythm 83 bpm ?Normal EKG ? ?Echocardiogram 10/26/2019:  ?1. Normal LV systolic function with visual EF 60-65%. Left ventricle  ?cavity is normal in size. Mild left ventricular hypertrophy. Normal global  ?wall motion. Normal diastolic  ?filling pattern, normal LAP. Calculated  EF 58%.  ?2. Structurally normal mitral valve. No evidence of mitral stenosis. Mild  ?(Grade I) mitral regurgitation.  ?3. Compared to prior study dated 08/02/2018 no significant changes. ? ?Exercise Sestamibi Stress Test 10/26/2019: ?Normal ECG stress. The patient exercised for 8 minutes and 0 seconds of a Bruce protocol, achieving approximately 10.13 METs and 89% MPHR. The baseline blood pressure was 130/76 mmHg and increased to 220/70 mmHg, which is a hypertensive response to exercise. Frequent PVC in bigeminal pattern in recovery and rare PVC during exercise stress.    ?Myocardial perfusion is normal. ?Overall LV systolic function is normal without regional wall motion abnormalities. Stress LV EF: 68%.  ?No previous exam available for comparison. Low risk.  ? ?CT Cardiac scoring 09/29/2019: ?1. Total Agatston score of 438, corresponding to 90th percentile for ?age, sex, and race based cohort. ?2. Aortic Atherosclerosis  ? ?LM: 168 ?LAD: 131 ?Lcx: 0 ?RCA: 0 ? ?Recent labs: ?12/17/2020: ?Glucose 123, BUN/Cr 8/0.5. EGFR 100. K 4.6. ?HbA1C 5.9% ?Chol 166, TG 80, HDL 55, LDL 97 ?TSH 0.8 normal ? ?05/17/2020: ?Chol 175, TG 80, HDL 54, LDL 106 ? ?02/08/2020: ?Chol 192, TG 107, HDL 52, LDL 121 ? ?09/08/2019: ?Glucose 123, BUN/Cr 8/0.59. EGFR 84 ?HbA1C 5.9% ?Chol 212, TG 122, HDL 44, LDL 146 ?TSH 0.9 normal ? ? ? ?Review of Systems  ?Cardiovascular:  Negative for chest pain, dyspnea on exertion, leg swelling, palpitations and syncope.  ?Musculoskeletal:  Positive for myalgias.  ? ?   ? ?  Vitals:  ? 05/21/21 0852  ?BP: 125/66  ?Pulse: 68  ?Resp: 16  ?Temp: 98 ?F (36.7 ?C)  ?SpO2: 94%  ? ? ? ?Body mass index is 29.29 kg/m?. Danley Danker Weights  ? 05/21/21 0852  ?Weight: 187 lb (84.8 kg)  ? ? ? ?Objective:  ? Physical Exam ?Vitals and nursing note reviewed.  ?Constitutional:   ?   General: She is not in acute distress. ?Neck:  ?   Vascular: No JVD.  ?Cardiovascular:  ?   Rate and Rhythm: Normal rate and regular rhythm.  ?   Heart  sounds: Normal heart sounds. No murmur heard. ?Pulmonary:  ?   Effort: Pulmonary effort is normal.  ?   Breath sounds: Normal breath sounds. No wheezing or rales.  ?Musculoskeletal:  ?   Right lower leg: No edema.  ?   Left lower leg: No edema.  ? ? ? ?   ?Assessment & Recommendations:  ? ?71 y.o. Caucasian female with hypothyroidism, hyperlipidemia, elevated calcium score. ? ?Elevated calcium score: ?438, including score of 168 in left main.  ?No ischemia on stress testing.  Continue aggressive risk factor modification. ?In absence of known bleeding issues, normal colonoscopy in 2020, benefits outweigh risks.  Continue aspirin 81 mg daily. ?See discussion regarding lipid management, as below. ? ? ?Hyperlipidemia, statin intolerance: ?LDL 97, HDL 54 (11/2020 ?Severe myalgias with Crestor, Lipitor, and pravastatin at even 40 mg. ?She does need aggressive lipid reduction given high calcium score. ?We discussed Repatha vs Nexlizet. She prefers the latter. ?Samples and prescription sent for Nexlizet 180-10 mg daily. ? ?Lipid panel and f/u in 3 months ? ? ?Nigel Mormon, MD ?Pager: [phone] ?Office: [phone] ?

## 2021-05-23 ENCOUNTER — Ambulatory Visit: Payer: Medicare HMO | Admitting: Cardiology

## 2021-05-30 ENCOUNTER — Ambulatory Visit: Payer: Medicare HMO | Admitting: Cardiology

## 2021-05-30 ENCOUNTER — Ambulatory Visit
Admission: RE | Admit: 2021-05-30 | Discharge: 2021-05-30 | Disposition: A | Discharge status: Home / Self Care | Payer: Medicare HMO | Source: Ambulatory Visit | Attending: Family Medicine | Admitting: Family Medicine

## 2021-05-30 DIAGNOSIS — M8580 Other specified disorders of bone density and structure, unspecified site: Secondary | ICD-10-CM

## 2021-06-28 ENCOUNTER — Ambulatory Visit: Payer: Self-pay | Admitting: Cardiology

## 2021-06-28 ENCOUNTER — Encounter: Payer: Self-pay | Admitting: Cardiology

## 2021-06-28 VITALS — BP 126/79 | HR 71 | Temp 98.1°F | Resp 16 | Ht 67.0 in | Wt 184.0 lb

## 2021-06-28 DIAGNOSIS — E782 Mixed hyperlipidemia: Secondary | ICD-10-CM

## 2021-06-28 DIAGNOSIS — I10 Essential (primary) hypertension: Secondary | ICD-10-CM

## 2021-06-28 NOTE — Progress Notes (Signed)
Needs prior auth on Nexlizet Repeat lipid panel and f/u in 3 months   Elder Negus, MD Pager: [phone] Office: [phone]

## 2021-07-08 ENCOUNTER — Other Ambulatory Visit: Payer: Self-pay

## 2021-07-08 DIAGNOSIS — E782 Mixed hyperlipidemia: Secondary | ICD-10-CM

## 2021-07-08 MED ORDER — NEXLIZET 180-10 MG PO TABS: 1.0000 | ORAL_TABLET | Freq: Every day | ORAL | 5 refills | Status: DC

## 2021-07-12 ENCOUNTER — Other Ambulatory Visit: Payer: Self-pay

## 2021-07-12 DIAGNOSIS — E782 Mixed hyperlipidemia: Secondary | ICD-10-CM

## 2021-09-28 LAB — LIPID PANEL
Chol/HDL Ratio: 2.9 ratio (ref 0.0–4.4)
Cholesterol, Total: 143 mg/dL (ref 100–199)
HDL: 50 mg/dL (ref >39)
LDL Chol Calc (NIH): 77 mg/dL (ref 0–99)
Triglycerides: 86 mg/dL (ref 0–149)
VLDL Cholesterol Cal: 16 mg/dL (ref 5–40)

## 2021-10-02 ENCOUNTER — Encounter: Payer: Self-pay | Admitting: Cardiology

## 2021-10-02 ENCOUNTER — Ambulatory Visit: Payer: Medicare HMO | Admitting: Cardiology

## 2021-10-02 VITALS — BP 144/76 | HR 68 | Temp 97.6°F | Resp 16 | Ht 67.0 in | Wt 180.8 lb

## 2021-10-02 DIAGNOSIS — E782 Mixed hyperlipidemia: Secondary | ICD-10-CM

## 2021-10-02 DIAGNOSIS — R931 Abnormal findings on diagnostic imaging of heart and coronary circulation: Secondary | ICD-10-CM

## 2021-10-02 NOTE — Progress Notes (Signed)
Follow up visit  Subjective:   Rebekah Curry, female    DOB: 06-10-1950, 71 y.o.   MRN: [id number]    HPI   Chief Complaint  Patient presents with   Hyperlipidemia         71 y.o. Caucasian female with hypothyroidism, hyperlipidemia, referred back for management of elevated calcium score.  Patient is doing well. Reviewed recent test results with the patient, details below.     Current Outpatient Medications:    aspirin EC 81 MG tablet, Take 1 tablet (81 mg total) by mouth daily. Swallow whole., Disp: 90 tablet, Rfl: 3   Bempedoic Acid-Ezetimibe (NEXLIZET) 180-10 MG TABS, Take 1 tablet by mouth daily., Disp: 30 tablet, Rfl: 5   cetirizine (ZYRTEC) 10 MG tablet, Take by mouth as needed., Disp: , Rfl:    Cholecalciferol (VITAMIN D) 2000 UNITS CAPS, Take 1 capsule by mouth daily., Disp: , Rfl:    fluticasone (FLONASE) 50 MCG/ACT nasal spray, as needed., Disp: , Rfl:    levothyroxine (SYNTHROID, LEVOTHROID) 137 MCG tablet, TAKE 2 TABLETS DAILY, Disp: , Rfl:    Multiple Vitamin (MULTIVITAMIN) capsule, Take 1 capsule by mouth daily., Disp: , Rfl:   Cardiovascular & other pertient studies:  EKG 05/21/2021: Sinus rhythm 83 bpm Normal EKG  Echocardiogram 10/26/2019:  1. Normal LV systolic function with visual EF 60-65%. Left ventricle  cavity is normal in size. Mild left ventricular hypertrophy. Normal global  wall motion. Normal diastolic  filling pattern, normal LAP. Calculated EF 58%.  2. Structurally normal mitral valve. No evidence of mitral stenosis. Mild  (Grade I) mitral regurgitation.  3. Compared to prior study dated 08/02/2018 no significant changes.  Exercise Sestamibi Stress Test 10/26/2019: Normal ECG stress. The patient exercised for 8 minutes and 0 seconds of a Bruce protocol, achieving approximately 10.13 METs and 89% MPHR. The baseline blood pressure was 130/76 mmHg and increased to 220/70 mmHg, which is a hypertensive response to exercise. Frequent PVC in  bigeminal pattern in recovery and rare PVC during exercise stress.    Myocardial perfusion is normal. Overall LV systolic function is normal without regional wall motion abnormalities. Stress LV EF: 68%.  No previous exam available for comparison. Low risk.   CT Cardiac scoring 09/29/2019: 1. Total Agatston score of 438, corresponding to 90th percentile for age, sex, and race based cohort. 2. Aortic Atherosclerosis   LM: 168 LAD: 131 Lcx: 0 RCA: 0  Recent labs: 09/27/2021: Chol 143, TG 86, HDL 50, LDL 77  12/17/2020: Glucose 123, BUN/Cr 8/0.5. EGFR 100. K 4.6. HbA1C 5.9% Chol 166, TG 80, HDL 55, LDL 97 TSH 0.8 normal  05/17/2020: Chol 175, TG 80, HDL 54, LDL 106  02/08/2020: Chol 192, TG 107, HDL 52, LDL 121  09/08/2019: Glucose 123, BUN/Cr 8/0.59. EGFR 84 HbA1C 5.9% Chol 212, TG 122, HDL 44, LDL 146 TSH 0.9 normal    Review of Systems  Cardiovascular:  Negative for chest pain, dyspnea on exertion, leg swelling, palpitations and syncope.  Musculoskeletal:  Positive for myalgias.        Vitals:   10/02/21 0835  BP: (!) 144/76  Pulse: 68  Resp: 16  Temp: 97.6 F (36.4 C)  SpO2: 99%     Body mass index is 28.32 kg/m. Filed Weights   10/02/21 0835  Weight: 180 lb 12.8 oz (82 kg)     Objective:   Physical Exam Vitals and nursing note reviewed.  Constitutional:      General: She is not  in acute distress. Neck:     Vascular: No JVD.  Cardiovascular:     Rate and Rhythm: Normal rate and regular rhythm.     Heart sounds: Normal heart sounds. No murmur heard. Pulmonary:     Effort: Pulmonary effort is normal.     Breath sounds: Normal breath sounds. No wheezing or rales.  Musculoskeletal:     Right lower leg: No edema.     Left lower leg: No edema.         Assessment & Recommendations:   71 y.o. Caucasian female with hypothyroidism, hyperlipidemia, elevated calcium score.  Elevated calcium score: 438, including score of 168 in left main.  No  ischemia on stress testing.  Continue aggressive risk factor modification. In absence of known bleeding issues, normal colonoscopy in 2020, benefits outweigh risks.  Continue aspirin 81 mg daily. See discussion regarding lipid management, as below.   Hyperlipidemia, statin intolerance: Severe myalgias with Crestor, Lipitor, and pravastatin at even 40 mg. LDL down to 77 on Nexilizet 180-10 mg daily. Provided samples and co-pay card. Sent refill. Recheck lipid panel in 6 months  F/u in 6 months    Nigel Mormon, MD Pager: [phone] Office: [phone]

## 2022-01-27 ENCOUNTER — Other Ambulatory Visit: Payer: Self-pay | Admitting: Family Medicine

## 2022-01-27 DIAGNOSIS — Z1231 Encounter for screening mammogram for malignant neoplasm of breast: Secondary | ICD-10-CM

## 2022-03-25 ENCOUNTER — Ambulatory Visit
Admission: RE | Admit: 2022-03-25 | Discharge: 2022-03-25 | Disposition: A | Discharge status: Home / Self Care | Payer: Medicare HMO | Source: Ambulatory Visit | Attending: Family Medicine | Admitting: Family Medicine

## 2022-03-25 DIAGNOSIS — Z1231 Encounter for screening mammogram for malignant neoplasm of breast: Secondary | ICD-10-CM

## 2022-04-02 ENCOUNTER — Encounter: Payer: Self-pay | Admitting: Cardiology

## 2022-04-02 ENCOUNTER — Ambulatory Visit: Payer: Medicare HMO | Admitting: Cardiology

## 2022-04-02 VITALS — BP 142/66 | HR 68 | Ht 67.0 in | Wt 171.0 lb

## 2022-04-02 DIAGNOSIS — E782 Mixed hyperlipidemia: Secondary | ICD-10-CM

## 2022-04-02 DIAGNOSIS — R931 Abnormal findings on diagnostic imaging of heart and coronary circulation: Secondary | ICD-10-CM

## 2022-04-02 MED ORDER — NEXLIZET 180-10 MG PO TABS: 1.0000 | ORAL_TABLET | Freq: Every day | ORAL | 3 refills | Status: DC

## 2022-04-02 NOTE — Progress Notes (Signed)
Follow up visit  Subjective:   Rebekah Curry, female    DOB: March 24, 1950, 72 y.o.   MRN: [id number]    HPI   Chief Complaint  Patient presents with   Elevated coronary artery calcium score   Follow-up    72 y.o. Caucasian female with hypothyroidism, hyperlipidemia, referred back for management of elevated calcium score.  Patient is doing well. Reviewed recent test results with the patient, details below. Blood pressure elevated in the office, but normal at home., Patient attributes this to traffic this morning.     Current Outpatient Medications:    aspirin EC 81 MG tablet, Take 1 tablet (81 mg total) by mouth daily. Swallow whole., Disp: 90 tablet, Rfl: 3   Bempedoic Acid-Ezetimibe (NEXLIZET) 180-10 MG TABS, Take 1 tablet by mouth daily., Disp: 30 tablet, Rfl: 5   cetirizine (ZYRTEC) 10 MG tablet, Take by mouth as needed., Disp: , Rfl:    Cholecalciferol (VITAMIN D) 2000 UNITS CAPS, Take 1 capsule by mouth daily., Disp: , Rfl:    fluticasone (FLONASE) 50 MCG/ACT nasal spray, as needed., Disp: , Rfl:    levothyroxine (SYNTHROID, LEVOTHROID) 137 MCG tablet, TAKE 2 TABLETS DAILY, Disp: , Rfl:    Multiple Vitamin (MULTIVITAMIN) capsule, Take 1 capsule by mouth daily., Disp: , Rfl:   Cardiovascular & other pertient studies:  EKG 04/02/2022: Sinus rhythm 66 bpm  Nonspecific T wave abnormality  Echocardiogram 10/26/2019:  1. Normal LV systolic function with visual EF 60-65%. Left ventricle  cavity is normal in size. Mild left ventricular hypertrophy. Normal global  wall motion. Normal diastolic  filling pattern, normal LAP. Calculated EF 58%.  2. Structurally normal mitral valve. No evidence of mitral stenosis. Mild  (Grade I) mitral regurgitation.  3. Compared to prior study dated 08/02/2018 no significant changes.  Exercise Sestamibi Stress Test 10/26/2019: Normal ECG stress. The patient exercised for 8 minutes and 0 seconds of a Bruce protocol, achieving approximately  10.13 METs and 89% MPHR. The baseline blood pressure was 130/76 mmHg and increased to 220/70 mmHg, which is a hypertensive response to exercise. Frequent PVC in bigeminal pattern in recovery and rare PVC during exercise stress.    Myocardial perfusion is normal. Overall LV systolic function is normal without regional wall motion abnormalities. Stress LV EF: 68%.  No previous exam available for comparison. Low risk.   CT Cardiac scoring 09/29/2019: 1. Total Agatston score of 438, corresponding to 90th percentile for age, sex, and race based cohort. 2. Aortic Atherosclerosis   LM: 168 LAD: 131 Lcx: 0 RCA: 0  Recent labs: 03/28/2022: Chol 143, TG 87, HDL 54, LDL 73 TSH 0.1 (Low)  12/24/2021: Glucose 123, BUN/Cr 10/0.5. EGFR 98. K 4.8. HbA1C 5.9%  09/08/2019: Glucose 123, BUN/Cr 8/0.59. EGFR 84 HbA1C 5.9% Chol 212, TG 122, HDL 44, LDL 146 TSH 0.9 normal    Review of Systems  Cardiovascular:  Negative for chest pain, dyspnea on exertion, leg swelling, palpitations and syncope.  Musculoskeletal:  Positive for myalgias.        Vitals:   04/02/22 0829  BP: (!) 142/66  Pulse: 68  SpO2: 97%     Body mass index is 26.78 kg/m. Filed Weights   04/02/22 0829  Weight: 171 lb (77.6 kg)     Objective:   Physical Exam Vitals and nursing note reviewed.  Constitutional:      General: She is not in acute distress. Neck:     Vascular: No JVD.  Cardiovascular:  Rate and Rhythm: Normal rate and regular rhythm.     Heart sounds: Normal heart sounds. No murmur heard. Pulmonary:     Effort: Pulmonary effort is normal.     Breath sounds: Normal breath sounds. No wheezing or rales.  Musculoskeletal:     Right lower leg: No edema.     Left lower leg: No edema.         Assessment & Recommendations:   72 y.o. Caucasian female with hypothyroidism, hyperlipidemia, elevated calcium score.  Elevated calcium score: 438, including score of 168 in left main.  No ischemia on  stress testing.  Continue aggressive risk factor modification. In absence of known bleeding issues, normal colonoscopy in 2020, benefits outweigh risks.  Continue aspirin 81 mg daily. See discussion regarding lipid management, as below.  Hyperlipidemia, statin intolerance: Severe myalgias with Crestor, Lipitor, and pravastatin at even 40 mg. Lipids well controlled on Nexilizet 180-10 mg daily. Chol 143, TG 87, HDL 54, LDL 73 (3/20234) Provided samples. Sent refill.  TSH 0.1. Levothyroxine dose adjustment as per PCP.  F/u in 1 year    Nigel Mormon, MD Pager: [phone] Office: [phone]

## 2022-04-25 ENCOUNTER — Emergency Department (HOSPITAL_COMMUNITY): Payer: Medicare HMO

## 2022-04-25 ENCOUNTER — Other Ambulatory Visit: Payer: Self-pay

## 2022-04-25 ENCOUNTER — Emergency Department (HOSPITAL_COMMUNITY)
Admission: EM | Admit: 2022-04-25 | Discharge: 2022-04-25 | Disposition: A | Discharge status: Home / Self Care | Payer: Medicare HMO | Attending: Student | Admitting: Student

## 2022-04-25 ENCOUNTER — Encounter (HOSPITAL_COMMUNITY): Payer: Self-pay | Admitting: *Deleted

## 2022-04-25 DIAGNOSIS — R0602 Shortness of breath: Secondary | ICD-10-CM | POA: Diagnosis not present

## 2022-04-25 DIAGNOSIS — I2510 Atherosclerotic heart disease of native coronary artery without angina pectoris: Secondary | ICD-10-CM | POA: Insufficient documentation

## 2022-04-25 DIAGNOSIS — R079 Chest pain, unspecified: Secondary | ICD-10-CM | POA: Diagnosis present

## 2022-04-25 DIAGNOSIS — E039 Hypothyroidism, unspecified: Secondary | ICD-10-CM | POA: Diagnosis not present

## 2022-04-25 DIAGNOSIS — Z1152 Encounter for screening for COVID-19: Secondary | ICD-10-CM | POA: Diagnosis not present

## 2022-04-25 DIAGNOSIS — Z87891 Personal history of nicotine dependence: Secondary | ICD-10-CM | POA: Insufficient documentation

## 2022-04-25 DIAGNOSIS — J181 Lobar pneumonia, unspecified organism: Secondary | ICD-10-CM | POA: Diagnosis not present

## 2022-04-25 DIAGNOSIS — Z79899 Other long term (current) drug therapy: Secondary | ICD-10-CM | POA: Insufficient documentation

## 2022-04-25 DIAGNOSIS — J189 Pneumonia, unspecified organism: Secondary | ICD-10-CM

## 2022-04-25 HISTORY — DX: Atherosclerotic heart disease of native coronary artery without angina pectoris: I25.10

## 2022-04-25 LAB — COMPREHENSIVE METABOLIC PANEL
ALT: 22 U/L (ref 0–44)
AST: 23 U/L (ref 15–41)
Albumin: 4.1 g/dL (ref 3.5–5.0)
Alkaline Phosphatase: 44 U/L (ref 38–126)
Anion gap: 9 (ref 5–15)
BUN: 12 mg/dL (ref 8–23)
CO2: 25 mmol/L (ref 22–32)
Calcium: 9 mg/dL (ref 8.9–10.3)
Chloride: 103 mmol/L (ref 98–111)
Creatinine, Ser: 0.64 mg/dL (ref 0.44–1.00)
GFR, Estimated: >60 mL/min (ref >60)
Glucose, Bld: 100 mg/dL — ABNORMAL HIGH (ref 70–99)
Potassium: 4.3 mmol/L (ref 3.5–5.1)
Sodium: 137 mmol/L (ref 135–145)
Total Bilirubin: 0.9 mg/dL (ref 0.3–1.2)
Total Protein: 7 g/dL (ref 6.5–8.1)

## 2022-04-25 LAB — CBC WITH DIFFERENTIAL/PLATELET
Abs Immature Granulocytes: 0.01 10*3/uL (ref 0.00–0.07)
Basophils Absolute: 0.1 10*3/uL (ref 0.0–0.1)
Basophils Relative: 2 %
Eosinophils Absolute: 0.2 10*3/uL (ref 0.0–0.5)
Eosinophils Relative: 3 %
HCT: 40.7 % (ref 36.0–46.0)
Hemoglobin: 13.3 g/dL (ref 12.0–15.0)
Immature Granulocytes: 0 %
Lymphocytes Relative: 39 %
Lymphs Abs: 2.4 10*3/uL (ref 0.7–4.0)
MCH: 31.7 pg (ref 26.0–34.0)
MCHC: 32.7 g/dL (ref 30.0–36.0)
MCV: 96.9 fL (ref 80.0–100.0)
Monocytes Absolute: 0.7 10*3/uL (ref 0.1–1.0)
Monocytes Relative: 11 %
Neutro Abs: 2.8 10*3/uL (ref 1.7–7.7)
Neutrophils Relative %: 45 %
Platelets: 345 10*3/uL (ref 150–400)
RBC: 4.2 MIL/uL (ref 3.87–5.11)
RDW: 12.5 % (ref 11.5–15.5)
WBC: 6.3 10*3/uL (ref 4.0–10.5)
nRBC: 0 % (ref 0.0–0.2)

## 2022-04-25 LAB — RESP PANEL BY RT-PCR (RSV, FLU A&B, COVID)  RVPGX2
Influenza A by PCR: NEGATIVE
Influenza B by PCR: NEGATIVE
Resp Syncytial Virus by PCR: NEGATIVE
SARS Coronavirus 2 by RT PCR: NEGATIVE

## 2022-04-25 LAB — BRAIN NATRIURETIC PEPTIDE: B Natriuretic Peptide: 21 pg/mL (ref 0.0–100.0)

## 2022-04-25 LAB — TROPONIN I (HIGH SENSITIVITY): Troponin I (High Sensitivity): 2 ng/L (ref <18)

## 2022-04-25 MED ORDER — AMOXICILLIN-POT CLAVULANATE 875-125 MG PO TABS: 1.0000 | ORAL_TABLET | Freq: Two times a day (BID) | ORAL | 0 refills | Status: DC

## 2022-04-25 MED ORDER — AZITHROMYCIN 250 MG PO TABS
500.0000 mg | ORAL_TABLET | Freq: Every day | ORAL | Status: DC
  Administered 2022-04-25: 500 mg via ORAL
  Filled 2022-04-25: qty 2

## 2022-04-25 MED ORDER — AMOXICILLIN-POT CLAVULANATE 875-125 MG PO TABS
1.0000 | ORAL_TABLET | Freq: Once | ORAL | Status: AC
  Administered 2022-04-25: 1 via ORAL
  Filled 2022-04-25: qty 1

## 2022-04-25 MED ORDER — AZITHROMYCIN 250 MG PO TABS: 250.0000 mg | ORAL_TABLET | Freq: Every day | ORAL | 0 refills | Status: DC

## 2022-04-25 NOTE — ED Provider Notes (Signed)
[facility] AT [facility] Provider Note  CSN: [id number] Arrival date & time: 04/25/22 8206  Chief Complaint(s) Chest Pain  HPI Rebekah Curry is a 72 y.o. female with PMH CAD, HLD, hypothyroidism who presents emergency room for evaluation of chest tightness and pain.  Patient states that she has had cold-like symptoms for the last 1 week and these have generally improved but starting this morning she has developed a pressure-like sensation in the chest and persistent cough.  She denies diaphoresis, nausea, vomiting or any exertional component to this chest pressure.  Denies headache, fever, abdominal pain, diarrhea or other systemic symptoms.   Past Medical History Past Medical History:  Diagnosis Date   Calcification of coronary artery    Colon polyp    Hyperlipidemia    Hypothyroidism    Low back pain    Patient Active Problem List   Diagnosis Date Noted   Myalgia due to statin 05/21/2021   Essential hypertension 11/16/2019   Elevated coronary artery calcium score 10/17/2019   Mixed hyperlipidemia    Hypothyroidism    Low back pain    Colon polyp    Home Medication(s) Prior to Admission medications   Medication Sig Start Date End Date Taking? Authorizing Provider  amoxicillin-clavulanate (AUGMENTIN) 875-125 MG tablet Take 1 tablet by mouth every 12 (twelve) hours. 04/25/22  Yes Kobi Mario, MD  azithromycin (ZITHROMAX) 250 MG tablet Take 1 tablet (250 mg total) by mouth daily. Starting 04/26/22 04/26/22  Yes Myangel Summons, MD  aspirin EC 81 MG tablet Take 1 tablet (81 mg total) by mouth daily. Swallow whole. 10/17/19   Patwardhan, Anabel Bene, MD  Bempedoic Acid-Ezetimibe (NEXLIZET) 180-10 MG TABS Take 1 tablet by mouth daily. 04/02/22   Patwardhan, Anabel Bene, MD  cetirizine (ZYRTEC) 10 MG tablet Take by mouth as needed.    [provider]  Cholecalciferol (VITAMIN D) 2000 UNITS CAPS Take 1 capsule by mouth daily.    [provider]   fluticasone (FLONASE) 50 MCG/ACT nasal spray as needed.    [provider]  levothyroxine (SYNTHROID, LEVOTHROID) 137 MCG tablet TAKE 2 TABLETS DAILY    [provider]  Multiple Vitamin (MULTIVITAMIN) capsule Take 1 capsule by mouth daily.    [provider]                                                                                                                                    Past Surgical History Past Surgical History:  Procedure Laterality Date   BREAST BIOPSY  2017   BREAST EXCISIONAL BIOPSY Left    BREAST LUMPECTOMY     THYROIDECTOMY     TOTAL HIP ARTHROPLASTY Bilateral    Family History Family History  Problem Relation Age of Onset   Heart disease Father     Social History Social History   Tobacco Use   Smoking status: Former  Packs/day: 1.00    Years: 20.00    Additional pack years: 0.00    Total pack years: 20.00    Types: Cigarettes    Quit date: 2000    Years since quitting: 24.2   Smokeless tobacco: Never  Vaping Use   Vaping Use: Never used  Substance Use Topics   Alcohol use: No   Drug use: No   Allergies Amoxil [amoxicillin], Gemfibrozil, and Statins  Review of Systems Review of Systems  Respiratory:  Positive for chest tightness and shortness of breath.   Cardiovascular:  Positive for chest pain.    Physical Exam Vital Signs  I have reviewed the triage vital signs BP 132/65 (BP Location: Right Arm)   Pulse 77   Temp 97.7 F (36.5 C) (Oral)   Resp 14   Ht 5\' 6"  (1.676 m)   Wt 74.4 kg   SpO2 98%   BMI 26.47 kg/m   Physical Exam Vitals and nursing note reviewed.  Constitutional:      General: She is not in acute distress.    Appearance: She is well-developed.  HENT:     Head: Normocephalic and atraumatic.  Eyes:     Conjunctiva/sclera: Conjunctivae normal.  Cardiovascular:     Rate and Rhythm: Normal rate and regular rhythm.     Heart sounds: No murmur heard. Pulmonary:     Effort:  Pulmonary effort is normal. No respiratory distress.     Breath sounds: Examination of the left-upper field reveals wheezing. Wheezing present.  Abdominal:     Palpations: Abdomen is soft.     Tenderness: There is no abdominal tenderness.  Musculoskeletal:        General: No swelling.     Cervical back: Neck supple.  Skin:    General: Skin is warm and dry.     Capillary Refill: Capillary refill takes less than 2 seconds.  Neurological:     Mental Status: She is alert.  Psychiatric:        Mood and Affect: Mood normal.     ED Results and Treatments Labs (all labs ordered are listed, but only abnormal results are displayed) Labs Reviewed  COMPREHENSIVE METABOLIC PANEL - Abnormal; Notable for the following components:      Result Value   Glucose, Bld 100 (*)    All other components within normal limits  RESP PANEL BY RT-PCR (RSV, FLU A&B, COVID)  RVPGX2  CBC WITH DIFFERENTIAL/PLATELET  BRAIN NATRIURETIC PEPTIDE  TROPONIN I (HIGH SENSITIVITY)  TROPONIN I (HIGH SENSITIVITY)                                                                                                                          Radiology DG Chest 2 View  Result Date: 04/25/2022 CLINICAL DATA:  Chest pain. Mid chest pressure and slight shortness of breath. EXAM: CHEST - 2 VIEW COMPARISON:  Chest radiographs 06/17/2017, 05/27/2017; FINDINGS: Cardiac silhouette is mildly enlarged. Mediastinal contours are within normal limits. There  is new opacification within the left upper lung, partially silhouetting the descending thoracic aorta and appearing to be associated with mild volume loss. Chronic left basilar linear scarring is unchanged from multiple prior radiographs. The right lung is clear. No pleural effusion or pneumothorax. Mild levocurvature of the upper thoracic spine with mild-to-moderate multilevel upper thoracic spine disc space narrowing. IMPRESSION: 1. New opacification within the left upper lung with mild volume  loss. This may represent pneumonia or atelectasis. Recommend radiographic follow-up in 4-6 weeks to ensure resolution. 2. Chronic left basilar scarring. Electronically Signed   By: Neita Garnetonald  Viola M.D.   On: 04/25/2022 08:26    Pertinent labs & imaging results that were available during my care of the patient were reviewed by me and considered in my medical decision making (see MDM for details).  Medications Ordered in ED Medications  azithromycin (ZITHROMAX) tablet 500 mg (500 mg Oral Given 04/25/22 0905)  amoxicillin-clavulanate (AUGMENTIN) 875-125 MG per tablet 1 tablet (1 tablet Oral Given 04/25/22 0905)                                                                                                                                     Procedures Procedures  (including critical care time)  Medical Decision Making / ED Course   This patient presents to the ED for concern of cough, chest pain, this involves an extensive number of treatment options, and is a complaint that carries with it a high risk of complications and morbidity.  The differential diagnosis includes pneumonia, reactive airway disease, asthma, COPD, aspiration, ACS, PE, malignancy  MDM: Patient seen in the emergency room for evaluation of chest pressure and cough.  Physical exam with isolated wheezing in the left upper lobe but is otherwise unremarkable.  Laboratory evaluation unremarkable including a negative high-sensitivity troponin and negative BNP.  ECG nonischemic with no ST elevations or depressions.  COVID, flu, RSV negative and obtained in the setting of upper respiratory symptoms.  Chest x-ray concerning for left upper lobe pneumonia.  Patient not hypoxic and does not meet inpatient criteria for admission in setting of pneumonia and thus we will initiate oral antibiotics with Augmentin and azithromycin.  Patient does have a "rash" associated with amoxicillin which patient said that she had a small red abdominal line 20 years  ago that previous doctors called a possible allergy.  Patient received Augmentin here in the emergency department without any urticaria as well as 500 of azithromycin.  Patient will take oral antibiotics and follow-up outpatient with her PCP.  Strict return precautions given of which she and her husband voiced understanding.  Patient then discharged   Additional history obtained: -Additional history obtained from husband -External records from outside source obtained and reviewed including: Chart review including previous notes, labs, imaging, consultation notes   Lab Tests: -I ordered, reviewed, and interpreted labs.   The pertinent results include:   Labs Reviewed  COMPREHENSIVE  METABOLIC PANEL - Abnormal; Notable for the following components:      Result Value   Glucose, Bld 100 (*)    All other components within normal limits  RESP PANEL BY RT-PCR (RSV, FLU A&B, COVID)  RVPGX2  CBC WITH DIFFERENTIAL/PLATELET  BRAIN NATRIURETIC PEPTIDE  TROPONIN I (HIGH SENSITIVITY)  TROPONIN I (HIGH SENSITIVITY)      EKG   EKG Interpretation  Date/Time:  Friday April 25 2022 07:45:52 EDT Ventricular Rate:  72 PR Interval:  190 QRS Duration: 95 QT Interval:  397 QTC Calculation: 435 R Axis:   83 Text Interpretation: Sinus rhythm Probable left atrial enlargement Confirmed by Rj Pedrosa ([id number]) on 04/25/2022 9:18:07 AM         Imaging Studies ordered: I ordered imaging studies including chest x-ray I independently visualized and interpreted imaging. I agree with the radiologist interpretation   Medicines ordered and prescription drug management: Meds ordered this encounter  Medications   amoxicillin-clavulanate (AUGMENTIN) 875-125 MG per tablet 1 tablet   azithromycin (ZITHROMAX) tablet 500 mg   amoxicillin-clavulanate (AUGMENTIN) 875-125 MG tablet    Sig: Take 1 tablet by mouth every 12 (twelve) hours.    Dispense:  14 tablet    Refill:  0   azithromycin (ZITHROMAX) 250 MG  tablet    Sig: Take 1 tablet (250 mg total) by mouth daily. Starting 04/26/22    Dispense:  5 tablet    Refill:  0    -I have reviewed the patients home medicines and have made adjustments as needed  Critical interventions none    Cardiac Monitoring: The patient was maintained on a cardiac monitor.  I personally viewed and interpreted the cardiac monitored which showed an underlying rhythm of: NSR  Social Determinants of Health:  Factors impacting patients care include: none   Reevaluation: After the interventions noted above, I reevaluated the patient and found that they have :improved  Co morbidities that complicate the patient evaluation  Past Medical History:  Diagnosis Date   Calcification of coronary artery    Colon polyp    Hyperlipidemia    Hypothyroidism    Low back pain       Dispostion: I considered admission for this patient, but at this time she does not meet inpatient criteria for admission she is safe for discharge with outpatient PCP follow-up and return precautions.  Patient discharged     Final Clinical Impression(s) / ED Diagnoses Final diagnoses:  Pneumonia of left upper lobe due to infectious organism     @PCDICTATION @    Glendora ScoreKommor, Ashaad Gaertner, MD 04/25/22 [phone]

## 2022-04-25 NOTE — ED Triage Notes (Signed)
Pt c/o waking up at 0400 with mid chest pressure and slight SOB. Pt reports she has felt like she has had a cold over the last few days. Denies dizziness, nausea, and diaphoresis.

## 2022-04-26 ENCOUNTER — Telehealth (HOSPITAL_COMMUNITY): Payer: Self-pay | Admitting: Student

## 2022-04-26 MED ORDER — PREDNISONE 20 MG PO TABS: 60.0000 mg | ORAL_TABLET | Freq: Once | ORAL | 0 refills | Status: AC

## 2022-04-26 MED ORDER — DOXYCYCLINE HYCLATE 100 MG PO CAPS: 100.0000 mg | ORAL_CAPSULE | Freq: Two times a day (BID) | ORAL | 0 refills | Status: DC

## 2022-04-26 NOTE — Telephone Encounter (Signed)
I spoke with the patient this morning.  Patient with urticarial rash likely secondary to Augmentin use.  This confirms patient distant amoxicillin allergy and steroids sent to the pharmacy.  Patient will discontinue both Augmentin and Zithromax as the exact origin of her urticarial rash is unknown and we will transition the patient to doxycycline twice daily.  No airway involvement today per patient over the phone.

## 2022-04-26 NOTE — ED Notes (Addendum)
04/26/2022 pt called to inform EDP that she has a rash form her ABT . EDP aware and voiced he would call pt. 0830hrs

## 2023-02-23 ENCOUNTER — Other Ambulatory Visit: Payer: Self-pay | Admitting: Family Medicine

## 2023-02-23 DIAGNOSIS — Z1231 Encounter for screening mammogram for malignant neoplasm of breast: Secondary | ICD-10-CM

## 2023-03-26 ENCOUNTER — Ambulatory Visit
Admission: RE | Admit: 2023-03-26 | Discharge: 2023-03-26 | Disposition: A | Discharge status: Home / Self Care | Payer: Medicare HMO | Source: Ambulatory Visit | Attending: Family Medicine | Admitting: Family Medicine

## 2023-03-26 DIAGNOSIS — Z1231 Encounter for screening mammogram for malignant neoplasm of breast: Secondary | ICD-10-CM

## 2023-04-06 ENCOUNTER — Other Ambulatory Visit (HOSPITAL_COMMUNITY): Payer: Self-pay

## 2023-04-06 ENCOUNTER — Ambulatory Visit: Payer: Medicare HMO | Attending: Cardiology | Admitting: Cardiology

## 2023-04-06 ENCOUNTER — Encounter: Payer: Self-pay | Admitting: Cardiology

## 2023-04-06 ENCOUNTER — Telehealth: Payer: Self-pay | Admitting: Pharmacy Technician

## 2023-04-06 VITALS — BP 118/60 | HR 65 | Ht 66.0 in | Wt 170.0 lb

## 2023-04-06 DIAGNOSIS — E782 Mixed hyperlipidemia: Secondary | ICD-10-CM

## 2023-04-06 DIAGNOSIS — R931 Abnormal findings on diagnostic imaging of heart and coronary circulation: Secondary | ICD-10-CM | POA: Diagnosis not present

## 2023-04-06 LAB — LIPID PANEL
Chol/HDL Ratio: 2.3 ratio (ref 0.0–4.4)
Cholesterol, Total: 138 mg/dL (ref 100–199)
HDL: 59 mg/dL (ref >39)
LDL Chol Calc (NIH): 65 mg/dL (ref 0–99)
Triglycerides: 66 mg/dL (ref 0–149)
VLDL Cholesterol Cal: 14 mg/dL (ref 5–40)

## 2023-04-06 MED ORDER — NEXLIZET 180-10 MG PO TABS
1.0000 | ORAL_TABLET | Freq: Every day | ORAL | 3 refills | Status: AC
Start: 2023-04-06 — End: ?

## 2023-04-06 NOTE — Telephone Encounter (Signed)
 Patient Advocate Encounter   The patient was approved for a [employer] grant that will help cover the cost of nexlizet Total amount awarded, 2500.00.  Effective: 03/07/23 - 03/05/24   [id number] WUX:[id number] [id number] [employer] ID: [id number]   Pharmacy provided with approval and processing information. Patient informed via telephone

## 2023-04-06 NOTE — Progress Notes (Signed)
  Cardiology Office Note:  .   Date:  04/06/2023  ID:  [name], DOB 30-Jan-1950, MRN [id number] PCP: Gweneth Dimitri, MD  [facility] Providers Cardiologist:  Truett Mainland, MD PCP: Gweneth Dimitri, MD  Chief Complaint  Patient presents with   Hyperlipidemia      History of Present Illness: .    Rebekah Curry is a 73 y.o. female with hypothyroidism, hyperlipidemia, elevated calcium score  Patient is doing well, denies any complaints today.  She walks regularly without any chest pain, shortness of breath.  She is compliant with Nexletol, without any side effects of myalgias.  Vitals:   04/06/23 0833  BP: 118/60  Pulse: 65  SpO2: 98%     ROS:  ROS   Studies Reviewed: Marland Kitchen        EKG 04/06/2023: Normal sinus rhythm Rightward axis When compared with ECG of 25-Apr-2022 07:45, No significant change was found    Independently interpreted 07/2022: Chol 146, TG 75, HDL 58, LDL 73 HbA1C 5.9% Hb 13.3 Cr 0.6 TSH 0.5     Physical Exam:   Physical Exam Vitals and nursing note reviewed.  Constitutional:      General: She is not in acute distress. Neck:     Vascular: No JVD.  Cardiovascular:     Rate and Rhythm: Normal rate and regular rhythm.     Heart sounds: Normal heart sounds. No murmur heard. Pulmonary:     Effort: Pulmonary effort is normal.     Breath sounds: Normal breath sounds. No wheezing or rales.  Musculoskeletal:     Right lower leg: No edema.     Left lower leg: No edema.      VISIT DIAGNOSES:   ICD-10-CM   1. Elevated coronary artery calcium score  R93.1 EKG 12-Lead    2. Mixed hyperlipidemia  E78.2 Bempedoic Acid-Ezetimibe (NEXLIZET) 180-10 MG TABS    Lipid panel       ASSESSMENT AND PLAN: .    Rebekah Curry is a 73 y.o. female with hypothyroidism, hyperlipidemia, elevated calcium score.   Elevated calcium score: 438, including score of 168 in left main.  No ischemia on stress testing (2021). Continue aggressive  risk factor modification. In absence of known bleeding issues, normal colonoscopy in 2020, benefits outweigh risks.  Continue aspirin 81 mg daily. See discussion regarding lipid management, as below.   Hyperlipidemia, statin intolerance: Severe myalgias with Crestor, Lipitor, and pravastatin at even 40 mg. Lipids well controlled on Nexilizet 180-10 mg daily. Chol 143, TG 87, HDL 54, LDL 73 (3/20234) Check lipid panel today. Refilled next visit.  Will check patient assistance forms  Meds ordered this encounter  Medications   Bempedoic Acid-Ezetimibe (NEXLIZET) 180-10 MG TABS    Sig: Take 1 tablet by mouth daily.    Dispense:  90 tablet    Refill:  3    Prior approved 01/20/2021 through 01/19/2022.     F/u in 1 year  Signed, Elder Negus, MD

## 2023-04-06 NOTE — Patient Instructions (Addendum)
 Medication Instructions:  Refilled Nexlizet   - Patient assistance contact information discussed   *If you need a refill on your cardiac medications before your next appointment, please call your pharmacy*   Lab Work: Lipid Panel   If you have labs (blood work) drawn today and your tests are completely normal, you will receive your results only by: MyChart Message (if you have MyChart) OR A paper copy in the mail If you have any lab test that is abnormal or we need to change your treatment, we will call you to review the results.  Follow-Up: At [facility], you and your health needs are our priority.  As part of our continuing mission to provide you with exceptional heart care, we have created designated Provider Care Teams.  These Care Teams include your primary Cardiologist (physician) and Advanced Practice Providers (APPs -  Physician Assistants and Nurse Practitioners) who all work together to provide you with the care you need, when you need it.   Your next appointment:   1 year(s)  Provider:   Elder Negus, MD     Other Instructions   1st Floor: - Lobby - Registration  - Pharmacy  - Lab - Cafe  2nd Floor: - PV Lab - Diagnostic Testing (echo, CT, nuclear med)  3rd Floor: - Vacant  4th Floor: - TCTS (cardiothoracic surgery) - AFib Clinic - Structural Heart Clinic - Vascular Surgery  - Vascular Ultrasound  5th Floor: - [facility] Cardiology (general and EP) - Clinical Pharmacy for coumadin, hypertension, lipid, weight-loss medications, and med management appointments    Valet parking services will be available as well.

## 2024-02-24 ENCOUNTER — Ambulatory Visit: Admitting: Internal Medicine

## 2024-02-24 ENCOUNTER — Encounter: Payer: Self-pay | Admitting: Internal Medicine

## 2024-02-24 VITALS — BP 124/71 | HR 77 | Ht 66.0 in | Wt 171.0 lb

## 2024-02-24 DIAGNOSIS — Z87891 Personal history of nicotine dependence: Secondary | ICD-10-CM | POA: Insufficient documentation

## 2024-02-24 DIAGNOSIS — J4489 Other specified chronic obstructive pulmonary disease: Secondary | ICD-10-CM | POA: Insufficient documentation

## 2024-02-24 NOTE — Assessment & Plan Note (Addendum)
 Quit 2017   Low-dose CT lung cancer screening is recommended for patients who are 19-73 years of age with a 20+ pack-year history of smoking and who are currently smoking or quit <=15 years ago. No coughing up blood  No unintentional weight loss of > 15 pounds in the last 6 months - pt is eligible for scanning yearly until 2032  Discussed in detail all the  indications, usual  risks and alternatives  relative to the benefits with patient who agrees to proceed with w/u as outlined.     Each maintenance medication was reviewed in detail including emphasizing most importantly the difference between maintenance and prns and under what circumstances the prns are to be triggered using an action plan format where appropriate.  Total time for H and P, chart review, counseling, reviewing hfa device(s) , directly observing portions of ambulatory 02 saturation study/ and generating customized AVS unique to this office visit / same day charting = 35 min new pt eval

## 2024-02-24 NOTE — Assessment & Plan Note (Addendum)
 Quit smoking 2017  - 02/24/2024  After extensive coaching inhaler device,  effectiveness =   60% from a baseline of 30%> continue symbicort 160  - 02/24/2024   Walked on RA  x  3  lap(s) =  approx 450  ft  @ mod fast  pace, stopped due to end of study  with lowest 02 sats 93% but finished at 94%   -Allergy screen 02/24/2024 >  Eos 0. /  IgE  with alpha one AT Phenotype/ level  pending     Unusual if hx is correct she had no problems until well p she quit smoking so need to look to allergies/ alpha one status and ? Bronchiectasis which might only show up on HRCT but we'll start with LDSCT for now and do f/u pfts   Discussed in detail all the  indications, usual  risks and alternatives  relative to the benefits with patient who agrees to proceed with w/u as outlined.

## 2024-02-24 NOTE — Patient Instructions (Addendum)
 Work on inhaler technique:  relax and gently blow all the way out then take a nice smooth full deep breath back in, triggering the inhaler at same time you start breathing in.  Hold breath in for at least  5 seconds if you can. Blow out breyna  thru nose. Rinse and gargle with water when done.  If mouth or throat bother you at all,  try brushing teeth/gums/tongue with arm and hammer toothpaste/ make a slurry and gargle and spit out.   >>>  Remember how golfers warm up by taking practice swings - do this with an empty inhaler   Please remember to go to the lab department   for your tests - we will call you with the results when they are available.      My office will be contacting you by phone for referral to lung cancer screening   [phone]) - if you don't hear back from my office within one week,  please call us  back or notify us  thru MyChart and we'll address it right away.     Please schedule a follow up office visit in 6 weeks, call sooner if needed with all medications /inhalers/ solutions in hand so we can verify exactly what you are taking. This includes all medications from all doctors and over the counters   PFTs on return

## 2024-02-24 NOTE — Progress Notes (Signed)
 "   Rebekah Curry, female    DOB: 01/01/1951    MRN: [id number]   Brief patient profile:  73  yowf   stopped smoking 2017  with no respiratory symptoms at same house since  referred to pulmonary clinic in [place]  02/24/2024 by Dr Sari Cable  for ? Asthma  p 2 bad spells of bronchitis (not covid related) between 2017 and 2020 when never completely recovered.  [facility] cardiology did treadmill and did fine around 2020   Pt not previously seen by PCCM service.     History of Present Illness  02/24/2024  Pulmonary/ 1st office eval/ [facility] / [place] [facility] last prednisone  was> 4  weeks prior to 1st ov / breyna 160  Chief Complaint  Patient presents with   Establish Care    Referred for hx of bronchitis, pna and asthma  Coughing with mucus sometimes   Dyspnea:  walking same speed as others  5 ladies  walk x 2 miles in 30 min most days  Cough: min mucoid worse p drinking  - no am cough  Sleep: flat bed one pillow s resp cc  SABA use: rarely  02: none  LDSCT:referred today   No obvious day to day or daytime pattern/variability or assoc excess/ purulent sputum or mucus plugs or hemoptysis or cp or chest tightness, subjective wheeze or overt  hb symptoms.    Also denies any obvious fluctuation of symptoms with weather or environmental changes or other aggravating or alleviating factors except as outlined above   No unusual exposure hx or h/o childhood pna/ asthma or knowledge of premature birth.  Current Allergies, Complete Past Medical History, Past Surgical History, Family History, and Social History were reviewed in [employer] record.  ROS  The following are not active complaints unless bolded Hoarseness, sore throat, dysphagia, dental problems, itching, sneezing,  nasal congestion or discharge of excess mucus or purulent secretions, ear ache,   fever, chills, sweats, unintended wt loss or wt gain, classically pleuritic or exertional cp,  orthopnea pnd or  arm/hand swelling  or leg swelling, presyncope, palpitations, abdominal pain, anorexia, nausea, vomiting, diarrhea  or change in bowel habits or change in bladder habits, change in stools or change in urine, dysuria, hematuria,  rash, arthralgias, visual complaints, headache, numbness, weakness or ataxia or problems with walking or coordination,  change in mood or  memory.            Outpatient Medications Prior to Visit  Medication Sig Dispense Refill   albuterol  (VENTOLIN  HFA) 108 (90 Base) MCG/ACT inhaler 1 puff as needed Inhalation every 6 hrs     aspirin  EC 81 MG tablet Take 1 tablet (81 mg total) by mouth daily. Swallow whole. 90 tablet 3   Bempedoic Acid-Ezetimibe  (NEXLIZET ) 180-10 MG TABS Take 1 tablet by mouth daily. 90 tablet 3   budesonide-formoterol (SYMBICORT) 160-4.5 MCG/ACT inhaler Inhale 2 puffs into the lungs.     cetirizine (ZYRTEC) 10 MG tablet Take by mouth as needed.     Cholecalciferol (VITAMIN D) 2000 UNITS CAPS Take 1 capsule by mouth daily.     fluticasone (FLONASE) 50 MCG/ACT nasal spray as needed.     levothyroxine (SYNTHROID, LEVOTHROID) 137 MCG tablet TAKE 2 TABLETS DAILY     Multiple Vitamin (MULTIVITAMIN) capsule Take 1 capsule by mouth daily.     doxycycline  (VIBRAMYCIN ) 100 MG capsule Take 1 capsule (100 mg total) by mouth 2 (two) times daily. (Patient not taking: Reported on  04/06/2023) 14 capsule 0   No facility-administered medications prior to visit.    Past Medical History:  Diagnosis Date   Calcification of coronary artery    Colon polyp    Hyperlipidemia    Hypothyroidism    Low back pain       Objective:     BP 124/71   Pulse 77   Ht 5' 6 (1.676 m)   Wt 171 lb (77.6 kg)   SpO2 94% Comment: ra  BMI 27.60 kg/m   SpO2: 94 % (ra) amb pleasant wf   Mod TE/ trace pw better with plb/ worse wit FVC    HEENT : Oropharynx  clear      Nasal turbinates mod edema / no polyps   NECK :  without  apparent JVD/ palpable Nodes/TM    LUNGS:  no acc muscle use,  Nl contour chest which is clear to A and P bilaterally without cough on insp or exp maneuvers   CV:  RRR  no s3 or murmur or increase in P2, and no edema   ABD:  soft and nontender   MS:  Gait nl   ext warm without deformities Or obvious joint restrictions  calf tenderness, cyanosis or clubbing    SKIN: warm and dry without lesions    NEURO:  alert, approp, nl sensorium with  no motor or cerebellar deficits apparent.       Assessment   Assessment & Plan Asthmatic bronchitis , chronic (HCC) Quit smoking 2017  - 02/24/2024  After extensive coaching inhaler device,  effectiveness =   60% from a baseline of 30%> continue symbicort 160  - 02/24/2024   Walked on RA  x  3  lap(s) =  approx 450  ft  @ mod fast  pace, stopped due to end of study  with lowest 02 sats 93% but finished at 94%   -Allergy screen 02/24/2024 >  Eos 0. /  IgE  with alpha one AT Phenotype/ level  pending     Unusual if hx is correct she had no problems until well p she quit smoking so need to look to allergies/ alpha one status and ? Bronchiectasis which might only show up on HRCT but we'll start with LDSCT for now and do f/u pfts   Discussed in detail all the  indications, usual  risks and alternatives  relative to the benefits with patient who agrees to proceed with w/u as outlined.     Former cigarette smoker Quit 2017   Low-dose CT lung cancer screening is recommended for patients who are 56-23 years of age with a 20+ pack-year history of smoking and who are currently smoking or quit <=15 years ago. No coughing up blood  No unintentional weight loss of > 15 pounds in the last 6 months - pt is eligible for scanning yearly until 2032  Discussed in detail all the  indications, usual  risks and alternatives  relative to the benefits with patient who agrees to proceed with w/u as outlined.     Each maintenance medication was reviewed in detail including emphasizing most importantly the difference  between maintenance and prns and under what circumstances the prns are to be triggered using an action plan format where appropriate.  Total time for H and P, chart review, counseling, reviewing hfa device(s) , directly observing portions of ambulatory 02 saturation study/ and generating customized AVS unique to this office visit / same day charting = 35 min new pt  eval                  AVS  Patient Instructions  Work on inhaler technique:  relax and gently blow all the way out then take a nice smooth full deep breath back in, triggering the inhaler at same time you start breathing in.  Hold breath in for at least  5 seconds if you can. Blow out breyna  thru nose. Rinse and gargle with water when done.  If mouth or throat bother you at all,  try brushing teeth/gums/tongue with arm and hammer toothpaste/ make a slurry and gargle and spit out.   >>>  Remember how golfers warm up by taking practice swings - do this with an empty inhaler   Please remember to go to the lab department   for your tests - we will call you with the results when they are available.      My office will be contacting you by phone for referral to lung cancer screening   [phone]) - if you don't hear back from my office within one week,  please call us  back or notify us  thru MyChart and we'll address it right away.     Please schedule a follow up office visit in 6 weeks, call sooner if needed with all medications /inhalers/ solutions in hand so we can verify exactly what you are taking. This includes all medications from all doctors and over the counters   PFTs on return    Ozell America, MD 02/24/2024      "

## 2024-02-25 ENCOUNTER — Ambulatory Visit: Admitting: Internal Medicine

## 2024-04-08 ENCOUNTER — Ambulatory Visit: Admitting: Cardiology
